# Patient Record
Sex: Female | Born: 1940
Health system: Southern US, Community
[De-identification: ages and names within clinical notes are randomized; demographics above are authoritative.]

## PROBLEM LIST (undated history)

## (undated) DIAGNOSIS — N281 Cyst of kidney, acquired: Secondary | ICD-10-CM

## (undated) DIAGNOSIS — Q211 Atrial septal defect: Secondary | ICD-10-CM

## (undated) DIAGNOSIS — I071 Rheumatic tricuspid insufficiency: Secondary | ICD-10-CM

## (undated) DIAGNOSIS — E119 Type 2 diabetes mellitus without complications: Secondary | ICD-10-CM

## (undated) DIAGNOSIS — R0602 Shortness of breath: Secondary | ICD-10-CM

## (undated) DIAGNOSIS — Z889 Allergy status to unspecified drugs, medicaments and biological substances status: Secondary | ICD-10-CM

## (undated) DIAGNOSIS — M51369 Other intervertebral disc degeneration, lumbar region without mention of lumbar back pain or lower extremity pain: Secondary | ICD-10-CM

## (undated) DIAGNOSIS — K7689 Other specified diseases of liver: Secondary | ICD-10-CM

## (undated) DIAGNOSIS — Q2112 Patent foramen ovale: Secondary | ICD-10-CM

## (undated) DIAGNOSIS — G510 Bell's palsy: Secondary | ICD-10-CM

## (undated) DIAGNOSIS — F419 Anxiety disorder, unspecified: Secondary | ICD-10-CM

## (undated) DIAGNOSIS — Z87442 Personal history of urinary calculi: Secondary | ICD-10-CM

## (undated) DIAGNOSIS — I10 Essential (primary) hypertension: Secondary | ICD-10-CM

## (undated) DIAGNOSIS — I499 Cardiac arrhythmia, unspecified: Secondary | ICD-10-CM

## (undated) DIAGNOSIS — E785 Hyperlipidemia, unspecified: Secondary | ICD-10-CM

## (undated) DIAGNOSIS — B029 Zoster without complications: Secondary | ICD-10-CM

## (undated) DIAGNOSIS — I340 Nonrheumatic mitral (valve) insufficiency: Secondary | ICD-10-CM

## (undated) DIAGNOSIS — H9192 Unspecified hearing loss, left ear: Secondary | ICD-10-CM

## (undated) DIAGNOSIS — Z950 Presence of cardiac pacemaker: Secondary | ICD-10-CM

## (undated) DIAGNOSIS — M5136 Other intervertebral disc degeneration, lumbar region: Secondary | ICD-10-CM

## (undated) DIAGNOSIS — I517 Cardiomegaly: Secondary | ICD-10-CM

## (undated) HISTORY — DX: Hyperlipidemia, unspecified: E78.5

## (undated) HISTORY — PX: CHOLECYSTECTOMY: SHX55

## (undated) HISTORY — DX: Anxiety disorder, unspecified: F41.9

## (undated) HISTORY — PX: OTHER SURGICAL HISTORY: SHX169

## (undated) HISTORY — PX: BUNIONECTOMY: SHX129

## (undated) HISTORY — PX: APPENDECTOMY: SHX54

## (undated) HISTORY — PX: EYE SURGERY: SHX253

---

## 2007-08-12 DIAGNOSIS — B029 Zoster without complications: Secondary | ICD-10-CM

## 2007-08-12 DIAGNOSIS — G510 Bell's palsy: Secondary | ICD-10-CM

## 2007-08-12 HISTORY — DX: Zoster without complications: B02.9

## 2007-08-12 HISTORY — DX: Bell's palsy: G51.0

## 2007-11-02 DIAGNOSIS — Z950 Presence of cardiac pacemaker: Secondary | ICD-10-CM

## 2007-11-02 HISTORY — DX: Presence of cardiac pacemaker: Z95.0

## 2013-10-13 ENCOUNTER — Other Ambulatory Visit: Payer: Self-pay | Admitting: Urology

## 2013-10-13 ENCOUNTER — Other Ambulatory Visit (HOSPITAL_COMMUNITY): Payer: Self-pay | Admitting: Urology

## 2013-10-13 DIAGNOSIS — N200 Calculus of kidney: Secondary | ICD-10-CM

## 2013-10-13 NOTE — Progress Notes (Signed)
Surgery on 10/24/13.  preop on 10/20/13 at 1030am.  Need orders in EPIC.  Thank You.

## 2013-10-18 ENCOUNTER — Encounter (HOSPITAL_COMMUNITY): Payer: Self-pay | Admitting: Pharmacy Technician

## 2013-10-19 ENCOUNTER — Encounter (HOSPITAL_COMMUNITY): Payer: Self-pay

## 2013-10-20 ENCOUNTER — Encounter (HOSPITAL_COMMUNITY)
Admission: RE | Admit: 2013-10-20 | Discharge: 2013-10-20 | Disposition: A | Discharge status: Home / Self Care | Payer: Medicare Other | Source: Ambulatory Visit | Attending: Urology | Admitting: Urology

## 2013-10-20 ENCOUNTER — Encounter (HOSPITAL_COMMUNITY): Payer: Self-pay

## 2013-10-20 ENCOUNTER — Other Ambulatory Visit: Payer: Self-pay | Admitting: Radiology

## 2013-10-20 ENCOUNTER — Ambulatory Visit (HOSPITAL_COMMUNITY)
Admission: RE | Admit: 2013-10-20 | Discharge: 2013-10-20 | Disposition: A | Discharge status: Home / Self Care | Payer: Medicare Other | Source: Ambulatory Visit | Attending: Anesthesiology | Admitting: Anesthesiology

## 2013-10-20 DIAGNOSIS — N2889 Other specified disorders of kidney and ureter: Secondary | ICD-10-CM | POA: Insufficient documentation

## 2013-10-20 DIAGNOSIS — Z01818 Encounter for other preprocedural examination: Secondary | ICD-10-CM | POA: Insufficient documentation

## 2013-10-20 DIAGNOSIS — Z01812 Encounter for preprocedural laboratory examination: Secondary | ICD-10-CM | POA: Insufficient documentation

## 2013-10-20 DIAGNOSIS — Z950 Presence of cardiac pacemaker: Secondary | ICD-10-CM | POA: Insufficient documentation

## 2013-10-20 HISTORY — DX: Other intervertebral disc degeneration, lumbar region: M51.36

## 2013-10-20 HISTORY — DX: Patent foramen ovale: Q21.12

## 2013-10-20 HISTORY — DX: Cardiac arrhythmia, unspecified: I49.9

## 2013-10-20 HISTORY — DX: Bell's palsy: G51.0

## 2013-10-20 HISTORY — DX: Personal history of urinary calculi: Z87.442

## 2013-10-20 HISTORY — DX: Allergy status to unspecified drugs, medicaments and biological substances: Z88.9

## 2013-10-20 HISTORY — DX: Zoster without complications: B02.9

## 2013-10-20 HISTORY — DX: Unspecified hearing loss, left ear: H91.92

## 2013-10-20 HISTORY — DX: Cardiomegaly: I51.7

## 2013-10-20 HISTORY — DX: Other specified diseases of liver: K76.89

## 2013-10-20 HISTORY — DX: Presence of cardiac pacemaker: Z95.0

## 2013-10-20 HISTORY — DX: Type 2 diabetes mellitus without complications: E11.9

## 2013-10-20 HISTORY — DX: Atrial septal defect: Q21.1

## 2013-10-20 HISTORY — DX: Cyst of kidney, acquired: N28.1

## 2013-10-20 HISTORY — DX: Other intervertebral disc degeneration, lumbar region without mention of lumbar back pain or lower extremity pain: M51.369

## 2013-10-20 HISTORY — DX: Nonrheumatic mitral (valve) insufficiency: I34.0

## 2013-10-20 HISTORY — DX: Rheumatic tricuspid insufficiency: I07.1

## 2013-10-20 HISTORY — DX: Shortness of breath: R06.02

## 2013-10-20 HISTORY — DX: Essential (primary) hypertension: I10

## 2013-10-20 LAB — PROTIME-INR
INR: 1.24 (ref 0.00–1.49)
PROTHROMBIN TIME: 15.3 s — AB (ref 11.6–15.2)

## 2013-10-20 LAB — BASIC METABOLIC PANEL
BUN: 10 mg/dL (ref 6–23)
CALCIUM: 9.8 mg/dL (ref 8.4–10.5)
CO2: 25 mEq/L (ref 19–32)
Chloride: 101 mEq/L (ref 96–112)
Creatinine, Ser: 0.72 mg/dL (ref 0.50–1.10)
GFR, EST NON AFRICAN AMERICAN: 84 mL/min — AB (ref >90)
GLUCOSE: 104 mg/dL — AB (ref 70–99)
Potassium: 4.3 mEq/L (ref 3.7–5.3)
Sodium: 141 mEq/L (ref 137–147)

## 2013-10-20 LAB — CBC
HCT: 37.9 % (ref 36.0–46.0)
Hemoglobin: 12.5 g/dL (ref 12.0–15.0)
MCH: 28 pg (ref 26.0–34.0)
MCHC: 33 g/dL (ref 30.0–36.0)
MCV: 84.8 fL (ref 78.0–100.0)
PLATELETS: 356 10*3/uL (ref 150–400)
RBC: 4.47 MIL/uL (ref 3.87–5.11)
RDW: 14 % (ref 11.5–15.5)
WBC: 7.8 10*3/uL (ref 4.0–10.5)

## 2013-10-20 LAB — APTT: aPTT: 31 seconds (ref 24–37)

## 2013-10-20 LAB — ABO/RH: ABO/RH(D): AB POS

## 2013-10-20 NOTE — Pre-Procedure Instructions (Signed)
CXR WAS DONE TODAY - PREOP - AT [facility]. EKG REPORT REQUESTED FROM PT'S CARDIOLOGIST'S OFFICE - DR. PAINTER.  CARDIOLOGIST OFFICE NOTES 07/19/13 ON PT'S CHART. PT HAS PACEMAKER - LAST INTERROGATION WAS 07/15/13- SUMMARY ON PT'S CHART.  DR. Sharyon Cable WAS FAXED PERIOPERATIVE PRESCRIPTION FOR IMPLANTED CARDIAC DEVICE PROGRAMMING X 2 - HE SIGNED THE ORDERS - BUT DID NOT COMPLETE.  COPY MADE OF PT'S ST JUDE PACEMAKER CARD AND PLACED ON CHART.

## 2013-10-20 NOTE — Patient Instructions (Signed)
   YOUR SURGERY IS SCHEDULED AT [facility]  ON:  Monday  3/16  REPORT TO  RADIOLOGY AT:  7:30 AM      PHONE # FOR SHORT STAY IS [phone]  DO NOT EAT OR DRINK ANYTHING AFTER MIDNIGHT THE NIGHT BEFORE YOUR SURGERY.  YOU [place] BRUSH YOUR TEETH, RINSE OUT YOUR MOUTH--BUT NO WATER, NO FOOD, NO CHEWING GUM, NO MINTS, NO CANDIES, NO CHEWING TOBACCO.  PLEASE TAKE THE FOLLOWING MEDICATIONS THE AM OF YOUR SURGERY WITH A FEW SIPS OF WATER:  CARDIZEM, METOPROLOL, SOTALOL.  IF YOU ARE DIABETIC:  DO NOT TAKE ANY DIABETIC MEDICATIONS THE AM OF YOUR SURGERY.  DO NOT BRING VALUABLES, MONEY, CREDIT CARDS.  DO NOT WEAR JEWELRY, MAKE-UP, NAIL POLISH AND NO METAL PINS OR CLIPS IN YOUR HAIR. CONTACT LENS, DENTURES / PARTIALS, GLASSES SHOULD NOT BE WORN TO SURGERY AND IN MOST CASES-HEARING AIDS WILL NEED TO BE REMOVED.  BRING YOUR GLASSES CASE, ANY EQUIPMENT NEEDED FOR YOUR CONTACT LENS. FOR PATIENTS ADMITTED TO THE HOSPITAL--CHECK OUT TIME THE DAY OF DISCHARGE IS 11:00 AM.  ALL INPATIENT ROOMS ARE PRIVATE - WITH BATHROOM, TELEPHONE, TELEVISION AND WIFI INTERNET.   FAILURE TO FOLLOW THESE INSTRUCTIONS [place] RESULT IN THE CANCELLATION OF YOUR SURGERY. PLEASE BE AWARE THAT YOU [place] NEED ADDITIONAL BLOOD DRAWN DAY OF YOUR SURGERY  PATIENT SIGNATURE_________________________________

## 2013-10-20 NOTE — Pre-Procedure Instructions (Signed)
EKG REPORT 07/19/13 RECEIVED FROM DR. PAINTER'S OFFICE AND ON PT'S CHART.

## 2013-10-21 NOTE — H&P (Signed)
History of Present Illness   Kristy Washington presents today as a referral from Dr Clyde Lundborg for consideration of percutaneous nephrolithotomy to manage her recently diagnosed partial staghorn stone. Kristy Washington is very pleasant 73 year old female without any real significant urologic history. She has no prior history of nephrolithiasis. She believes her father had 1 episode. As a younger woman, she did have occasional sporadic urinary tract infections, but that has not been a real significant recent problem. More recently she did experience some limited gross hematuria. A CT scan was performed at [facility], which I have reviewed. She had a rather large 2-3 cm stone at the ureteropelvic junction filling her renal pelvis and causing at least some mild obstruction. There were additional stones within the lower pole calyx and is really felt to be a partial staghorn calculus. Dr Marica Otter quite appropriately felt that this amount of stone burden would be inappropriate for ESWL and kindly referred her to [place] for consideration of a percutaneous nephrolithotomy. Complicating factors include a history of atrial fibrillation and pacemaker implant. She is on chronic coagulation and more recently has been on Xarelto. In the past she was on Coumadin, which had been discontinued periodically for procedures, but she has been on Xarelto fairly regularly. She also takes a daily aspirin. She was treated for urinary tract infection, although it was unclear to me whether her urine was abnormal due to the urinary tract inflammation or whether she had a true infection. She reports cloudy urine but no real dysuria or other problematic voiding. Her urinalysis today does show too numerous to count white cells and too numerous to count red cells. Urine pH is 6.0.    Past Medical History Problems  1. History of atrial fibrillation (V12.59) 2. History of diabetes mellitus (V12.29) 3. History of hypertension (V12.59)  Surgical  History Problems  1. History of Appendectomy 2. History of Cataract Surgery 3. History of Cholecystectomy 4. History of Pacemaker Placement 5. History of Surgical Excision Of Ectopic Pregnancy Unspecified Location  Current Meds 1. AmLODIPine Besylate 2.5 MG Oral Tablet;  Therapy: (Recorded:03Mar2015) to Recorded 2. Calcium + D TABS;  Therapy: (Recorded:03Mar2015) to Recorded 3. Cartia XT 240 MG Oral Capsule Extended Release 24 Hour;  Therapy: (Recorded:03Mar2015) to Recorded 4. Lisinopril 20 MG Oral Tablet;  Therapy: (Recorded:03Mar2015) to Recorded 5. MetFORMIN HCl - 1000 MG Oral Tablet;  Therapy: (Recorded:03Mar2015) to Recorded 6. Metoprolol Tartrate 50 MG Oral Tablet;  Therapy: (Recorded:03Mar2015) to Recorded 7. Potassium Chloride Crys ER 20 MEQ Oral Tablet Extended Release;  Therapy: [id number] to Recorded 8. Simvastatin 5 MG Oral Tablet;  Therapy: (Recorded:03Mar2015) to Recorded 9. Sotalol HCl - 80 MG Oral Tablet;  Therapy: (Recorded:03Mar2015) to Recorded 10. Xarelto 20 MG Oral Tablet;   Therapy: (Recorded:03Mar2015) to Recorded  Allergies Medication  1. Actos TABS  Family History Problems  1. Family history of COPD (chronic obstructive pulmonary disease) with emphysema :  Mother 2. Family history of kidney stones (V18.69) : Father  Social History Problems    Denied: History of Alcohol use   Caffeine use (V49.89)   rare   Divorced   Father deceased   67   Mother deceased   71 from COPD   Never smoker   Denied: History of Number of children   Retired  Review of Systems Genitourinary, constitutional, skin, eye, otolaryngeal, hematologic/lymphatic, cardiovascular, pulmonary, endocrine, musculoskeletal, gastrointestinal, neurological and psychiatric system(s) were reviewed and pertinent findings if present are noted.  Genitourinary: nocturia, incontinence and hematuria.  Musculoskeletal: back  pain.    Vitals Vital Signs [Data Includes: Last 1  Day]  Recorded: [id number] 11:40AM  Height: 5 ft 6 in Weight: 158 lb  BMI Calculated: 25.5 BSA Calculated: 1.81 Blood Pressure: 129 / 70 Temperature: 98.3 F Heart Rate: 71  Physical Exam Constitutional: Well nourished and well developed . No acute distress.  ENT:. The ears and nose are normal in appearance.  Neck: The appearance of the neck is normal and no neck mass is present.  Pulmonary: No respiratory distress and normal respiratory rhythm and effort.  Cardiovascular: Heart rate and rhythm are normal . No peripheral edema.  Abdomen: The abdomen is soft and nontender. No masses are palpated. No CVA tenderness. No hernias are palpable. No hepatosplenomegaly noted.  Skin: Normal skin turgor, no visible rash and no visible skin lesions.  Neuro/Psych:. Mood and affect are appropriate.    Results/Data Urine [Data Includes: Last 1 Day]   [id number]  COLOR YELLOW   APPEARANCE CLOUDY   SPECIFIC GRAVITY 1.015   pH 6.0   GLUCOSE NEG mg/dL  BILIRUBIN NEG   KETONE NEG mg/dL  BLOOD LARGE   PROTEIN 100 mg/dL  UROBILINOGEN 0.2 mg/dL  NITRITE NEG   LEUKOCYTE ESTERASE MOD   SQUAMOUS EPITHELIAL/HPF RARE   WBC TNTC WBC/hpf  RBC TNTC RBC/hpf  BACTERIA MANY   CRYSTALS NONE SEEN   CASTS NONE SEEN    Assessment Assessed  1. Chronic cystitis (595.2) 2. Staghorn calculus (592.0)  Plan Chronic cystitis  1. Follow-up Schedule Surgery Office  Follow-up  Status: Hold For - Appointment   Requested for: [id number] 2. URINE CULTURE; Status:In Progress - Specimen/Data Collected;   Done: [id number] Health Maintenance  3. UA With REFLEX; [Do Not Release]; Status:Complete;   Done: [id number] 11:07AM  Discussion/Summary   Kristy Glazer has a partial staghorn stone involving the right kidney. This does appear to be causing some mild partial obstruction but otherwise she seems to be doing relatively well clinically. There is no urgent need for intervention but certainly nothing good can come of leaving the  stone of this size in her right renal unit. I do think that percutaneous nephrolithotomy would certainly be the treatment of choice and the overall stone burden would be accepted for ESWL. In addition, she would need to stop her anticoagulation for either procedure and it would make much more sense to try to definitively manage her problem with one intervention if at all possible. It is possible that we will only be able to debulk her stone and there Mang[place] be some residual small stones in the calyces. If that occurs, we can either consider a second look flexible ureteroscopic approach through a nephrostomy tube, or consider ESWL at a later date. I did explain a percutaneous nephrolithotomy procedure to her. We would have Interventional Radiology provide access the morning of the procedure and then proceed with a definitive treatment. She would need to hold aspirin for at least 5-7 days as well as her Xarelto. We will need to contact her cardiologist in [place] to be sure that he provides permission for that. We will try to set something up for sometime in the next several weeks.   Urine culture will need to be performed today. Hold off on any empiric antibiotic use until we have culture data.

## 2013-10-24 ENCOUNTER — Observation Stay (HOSPITAL_COMMUNITY)
Admission: RE | Admit: 2013-10-24 | Discharge: 2013-10-26 | Disposition: A | Discharge status: Home / Self Care | Payer: Medicare Other | Source: Ambulatory Visit | Attending: Urology | Admitting: Urology

## 2013-10-24 ENCOUNTER — Ambulatory Visit (HOSPITAL_COMMUNITY): Payer: Medicare Other | Admitting: Anesthesiology

## 2013-10-24 ENCOUNTER — Ambulatory Visit (HOSPITAL_COMMUNITY): Payer: Medicare Other

## 2013-10-24 ENCOUNTER — Encounter (HOSPITAL_COMMUNITY): Payer: Self-pay | Admitting: Anesthesiology

## 2013-10-24 ENCOUNTER — Encounter (HOSPITAL_COMMUNITY): Payer: Self-pay

## 2013-10-24 ENCOUNTER — Ambulatory Visit (HOSPITAL_COMMUNITY)
Admission: RE | Admit: 2013-10-24 | Discharge: 2013-10-24 | Disposition: A | Discharge status: Home / Self Care | Payer: Medicare Other | Source: Ambulatory Visit | Attending: Urology | Admitting: Urology

## 2013-10-24 ENCOUNTER — Encounter (HOSPITAL_COMMUNITY): Payer: Medicare Other | Admitting: Anesthesiology

## 2013-10-24 ENCOUNTER — Encounter (HOSPITAL_COMMUNITY): Payer: Self-pay | Admitting: *Deleted

## 2013-10-24 ENCOUNTER — Encounter (HOSPITAL_COMMUNITY)
Admission: RE | Disposition: A | Discharge status: Home / Self Care | Payer: Self-pay | Source: Ambulatory Visit | Attending: Urology

## 2013-10-24 DIAGNOSIS — Z7901 Long term (current) use of anticoagulants: Secondary | ICD-10-CM | POA: Insufficient documentation

## 2013-10-24 DIAGNOSIS — Z950 Presence of cardiac pacemaker: Secondary | ICD-10-CM | POA: Insufficient documentation

## 2013-10-24 DIAGNOSIS — N1330 Unspecified hydronephrosis: Secondary | ICD-10-CM | POA: Insufficient documentation

## 2013-10-24 DIAGNOSIS — E119 Type 2 diabetes mellitus without complications: Secondary | ICD-10-CM | POA: Insufficient documentation

## 2013-10-24 DIAGNOSIS — Z79899 Other long term (current) drug therapy: Secondary | ICD-10-CM | POA: Insufficient documentation

## 2013-10-24 DIAGNOSIS — N200 Calculus of kidney: Principal | ICD-10-CM | POA: Insufficient documentation

## 2013-10-24 DIAGNOSIS — I059 Rheumatic mitral valve disease, unspecified: Secondary | ICD-10-CM | POA: Insufficient documentation

## 2013-10-24 DIAGNOSIS — I10 Essential (primary) hypertension: Secondary | ICD-10-CM | POA: Insufficient documentation

## 2013-10-24 DIAGNOSIS — I4891 Unspecified atrial fibrillation: Secondary | ICD-10-CM | POA: Insufficient documentation

## 2013-10-24 DIAGNOSIS — N3020 Other chronic cystitis without hematuria: Secondary | ICD-10-CM | POA: Insufficient documentation

## 2013-10-24 DIAGNOSIS — Z9089 Acquired absence of other organs: Secondary | ICD-10-CM | POA: Insufficient documentation

## 2013-10-24 HISTORY — PX: NEPHROLITHOTOMY: SHX5134

## 2013-10-24 LAB — GLUCOSE, CAPILLARY
GLUCOSE-CAPILLARY: 136 mg/dL — AB (ref 70–99)
GLUCOSE-CAPILLARY: 157 mg/dL — AB (ref 70–99)
GLUCOSE-CAPILLARY: 147 mg/dL — AB (ref 70–99)
Glucose-Capillary: 145 mg/dL — ABNORMAL HIGH (ref 70–99)

## 2013-10-24 LAB — TYPE AND SCREEN
ABO/RH(D): AB POS
Antibody Screen: NEGATIVE

## 2013-10-24 SURGERY — NEPHROLITHOTOMY PERCUTANEOUS
Anesthesia: General | Site: Flank | Laterality: Right

## 2013-10-24 MED ORDER — MIDAZOLAM HCL 2 MG/2ML IJ SOLN
INTRAMUSCULAR | Status: AC | PRN
  Administered 2013-10-24 (×2): 0.5 mg via INTRAVENOUS
  Administered 2013-10-24: 1 mg via INTRAVENOUS

## 2013-10-24 MED ORDER — OXYBUTYNIN CHLORIDE 5 MG PO TABS
5.0000 mg | ORAL_TABLET | Freq: Three times a day (TID) | ORAL | Status: DC | PRN
  Filled 2013-10-24: qty 1

## 2013-10-24 MED ORDER — FENTANYL CITRATE 0.05 MG/ML IJ SOLN
INTRAMUSCULAR | Status: AC
  Filled 2013-10-24: qty 2

## 2013-10-24 MED ORDER — METFORMIN HCL 500 MG PO TABS
500.0000 mg | ORAL_TABLET | Freq: Two times a day (BID) | ORAL | Status: DC
  Administered 2013-10-24 – 2013-10-26 (×4): 500 mg via ORAL
  Filled 2013-10-24 (×7): qty 1

## 2013-10-24 MED ORDER — LORAZEPAM 2 MG/ML IJ SOLN: INTRAMUSCULAR | Status: AC | PRN

## 2013-10-24 MED ORDER — DEXTROSE 5 % IV SOLN
2.0000 g | Freq: Once | INTRAVENOUS | Status: AC
  Administered 2013-10-24: 2 g via INTRAVENOUS
  Filled 2013-10-24: qty 2

## 2013-10-24 MED ORDER — SODIUM CHLORIDE 0.9 % IR SOLN
Status: DC | PRN
  Administered 2013-10-24 (×4): 3000 mL

## 2013-10-24 MED ORDER — 0.9 % SODIUM CHLORIDE (POUR BTL) OPTIME
TOPICAL | Status: DC | PRN
  Administered 2013-10-24: 1000 mL

## 2013-10-24 MED ORDER — HYDROMORPHONE HCL PF 1 MG/ML IJ SOLN
0.2500 mg | INTRAMUSCULAR | Status: DC | PRN
  Administered 2013-10-24: 0.5 mg via INTRAVENOUS

## 2013-10-24 MED ORDER — PROPOFOL 10 MG/ML IV BOLUS
INTRAVENOUS | Status: AC
  Filled 2013-10-24: qty 20

## 2013-10-24 MED ORDER — GLYCOPYRROLATE 0.2 MG/ML IJ SOLN
INTRAMUSCULAR | Status: AC
  Filled 2013-10-24: qty 3

## 2013-10-24 MED ORDER — NEOSTIGMINE METHYLSULFATE 1 MG/ML IJ SOLN
INTRAMUSCULAR | Status: DC | PRN
  Administered 2013-10-24: 4 mg via INTRAVENOUS

## 2013-10-24 MED ORDER — NEOSTIGMINE METHYLSULFATE 1 MG/ML IJ SOLN
INTRAMUSCULAR | Status: AC
  Filled 2013-10-24: qty 10

## 2013-10-24 MED ORDER — FENTANYL CITRATE 0.05 MG/ML IJ SOLN
INTRAMUSCULAR | Status: AC
  Filled 2013-10-24: qty 5

## 2013-10-24 MED ORDER — LIDOCAINE HCL 1 % IJ SOLN
INTRAMUSCULAR | Status: AC
  Filled 2013-10-24: qty 20

## 2013-10-24 MED ORDER — LACTATED RINGERS IV SOLN
INTRAVENOUS | Status: DC
  Administered 2013-10-24: 1000 mL via INTRAVENOUS

## 2013-10-24 MED ORDER — SOTALOL HCL 80 MG PO TABS: 80.0000 mg | ORAL_TABLET | Freq: Two times a day (BID) | ORAL | Status: DC

## 2013-10-24 MED ORDER — MIDAZOLAM HCL 2 MG/2ML IJ SOLN
INTRAMUSCULAR | Status: AC
  Filled 2013-10-24: qty 8

## 2013-10-24 MED ORDER — CEFAZOLIN SODIUM-DEXTROSE 2-3 GM-% IV SOLR: 2.0000 g | INTRAVENOUS | Status: DC

## 2013-10-24 MED ORDER — LACTATED RINGERS IV SOLN
INTRAVENOUS | Status: AC | PRN
  Administered 2013-10-24: 100 mL/h via INTRAVENOUS

## 2013-10-24 MED ORDER — CISATRACURIUM BESYLATE 20 MG/10ML IV SOLN
INTRAVENOUS | Status: AC
  Filled 2013-10-24: qty 10

## 2013-10-24 MED ORDER — SIMVASTATIN 5 MG PO TABS
5.0000 mg | ORAL_TABLET | Freq: Every day | ORAL | Status: DC
  Administered 2013-10-24 – 2013-10-25 (×2): 5 mg via ORAL
  Filled 2013-10-24 (×4): qty 1

## 2013-10-24 MED ORDER — METOPROLOL TARTRATE 50 MG PO TABS
50.0000 mg | ORAL_TABLET | Freq: Two times a day (BID) | ORAL | Status: DC
  Administered 2013-10-24 – 2013-10-25 (×3): 50 mg via ORAL
  Filled 2013-10-24 (×6): qty 1

## 2013-10-24 MED ORDER — LISINOPRIL 20 MG PO TABS
20.0000 mg | ORAL_TABLET | Freq: Two times a day (BID) | ORAL | Status: DC
  Administered 2013-10-24 – 2013-10-25 (×3): 20 mg via ORAL
  Filled 2013-10-24 (×6): qty 1

## 2013-10-24 MED ORDER — LACTATED RINGERS IV SOLN
INTRAVENOUS | Status: DC
  Administered 2013-10-24 (×2): via INTRAVENOUS

## 2013-10-24 MED ORDER — MORPHINE SULFATE 2 MG/ML IJ SOLN
2.0000 mg | INTRAMUSCULAR | Status: DC | PRN
  Administered 2013-10-24 – 2013-10-25 (×3): 2 mg via INTRAVENOUS
  Filled 2013-10-24 (×4): qty 1

## 2013-10-24 MED ORDER — SOTALOL HCL 120 MG PO TABS
120.0000 mg | ORAL_TABLET | Freq: Every day | ORAL | Status: DC
  Administered 2013-10-25: 120 mg via ORAL
  Filled 2013-10-24 (×2): qty 1

## 2013-10-24 MED ORDER — FENTANYL CITRATE 0.05 MG/ML IJ SOLN
INTRAMUSCULAR | Status: AC | PRN
Start: 2013-10-24 — End: 2013-10-24
  Administered 2013-10-24: 50 ug via INTRAVENOUS

## 2013-10-24 MED ORDER — LIDOCAINE HCL 1 % IJ SOLN
INTRAMUSCULAR | Status: AC
  Filled 2013-10-24: qty 40

## 2013-10-24 MED ORDER — POTASSIUM CHLORIDE CRYS ER 20 MEQ PO TBCR
20.0000 meq | EXTENDED_RELEASE_TABLET | Freq: Every day | ORAL | Status: DC
  Administered 2013-10-24 – 2013-10-25 (×2): 20 meq via ORAL
  Filled 2013-10-24 (×4): qty 1

## 2013-10-24 MED ORDER — SOTALOL HCL 80 MG PO TABS
80.0000 mg | ORAL_TABLET | Freq: Every day | ORAL | Status: DC
  Administered 2013-10-24 – 2013-10-25 (×2): 80 mg via ORAL
  Filled 2013-10-24 (×4): qty 1

## 2013-10-24 MED ORDER — PROMETHAZINE HCL 25 MG/ML IJ SOLN
INTRAMUSCULAR | Status: AC
  Filled 2013-10-24: qty 1

## 2013-10-24 MED ORDER — HYDROMORPHONE HCL PF 1 MG/ML IJ SOLN
INTRAMUSCULAR | Status: AC
  Filled 2013-10-24: qty 1

## 2013-10-24 MED ORDER — CEFAZOLIN SODIUM-DEXTROSE 2-3 GM-% IV SOLR
INTRAVENOUS | Status: AC
  Administered 2013-10-24: 2000 mg
  Filled 2013-10-24: qty 50

## 2013-10-24 MED ORDER — AMLODIPINE BESYLATE 2.5 MG PO TABS
2.5000 mg | ORAL_TABLET | Freq: Every evening | ORAL | Status: DC
  Administered 2013-10-24 – 2013-10-25 (×2): 2.5 mg via ORAL
  Filled 2013-10-24 (×4): qty 1

## 2013-10-24 MED ORDER — FENTANYL CITRATE 0.05 MG/ML IJ SOLN
INTRAMUSCULAR | Status: AC
  Administered 2013-10-24 (×2): 50 ug via INTRAVENOUS
  Filled 2013-10-24: qty 8

## 2013-10-24 MED ORDER — PROPOFOL 10 MG/ML IV BOLUS
INTRAVENOUS | Status: DC | PRN
  Administered 2013-10-24: 100 mg via INTRAVENOUS

## 2013-10-24 MED ORDER — PHENYLEPHRINE 40 MCG/ML (10ML) SYRINGE FOR IV PUSH (FOR BLOOD PRESSURE SUPPORT)
PREFILLED_SYRINGE | INTRAVENOUS | Status: AC
  Filled 2013-10-24: qty 10

## 2013-10-24 MED ORDER — GLYCOPYRROLATE 0.2 MG/ML IJ SOLN
INTRAMUSCULAR | Status: DC | PRN
  Administered 2013-10-24: 0.6 mg via INTRAVENOUS

## 2013-10-24 MED ORDER — CEFAZOLIN SODIUM 1-5 GM-% IV SOLN
1.0000 g | Freq: Three times a day (TID) | INTRAVENOUS | Status: AC
  Administered 2013-10-24 – 2013-10-25 (×2): 1 g via INTRAVENOUS
  Filled 2013-10-24 (×2): qty 50

## 2013-10-24 MED ORDER — CISATRACURIUM BESYLATE (PF) 10 MG/5ML IV SOLN
INTRAVENOUS | Status: DC | PRN
  Administered 2013-10-24: 6 mg via INTRAVENOUS

## 2013-10-24 MED ORDER — KCL IN DEXTROSE-NACL 20-5-0.45 MEQ/L-%-% IV SOLN
INTRAVENOUS | Status: DC
  Administered 2013-10-24: 19:00:00 via INTRAVENOUS
  Filled 2013-10-24 (×5): qty 1000

## 2013-10-24 MED ORDER — FENTANYL CITRATE 0.05 MG/ML IJ SOLN
25.0000 ug | INTRAMUSCULAR | Status: DC | PRN
  Administered 2013-10-24 (×2): 50 ug via INTRAVENOUS

## 2013-10-24 MED ORDER — IOHEXOL 300 MG/ML  SOLN
INTRAMUSCULAR | Status: DC | PRN
  Administered 2013-10-24: 5 mL

## 2013-10-24 MED ORDER — PHENYLEPHRINE HCL 10 MG/ML IJ SOLN
INTRAMUSCULAR | Status: DC | PRN
  Administered 2013-10-24 (×3): 40 ug via INTRAVENOUS

## 2013-10-24 MED ORDER — IOHEXOL 300 MG/ML  SOLN
50.0000 mL | Freq: Once | INTRAMUSCULAR | Status: AC | PRN
  Administered 2013-10-24: 10 mL

## 2013-10-24 MED ORDER — PROMETHAZINE HCL 25 MG/ML IJ SOLN
6.2500 mg | INTRAMUSCULAR | Status: DC | PRN
Start: 2013-10-24 — End: 2013-10-24
  Administered 2013-10-24: 6.25 mg via INTRAVENOUS

## 2013-10-24 MED ORDER — ONDANSETRON HCL 4 MG/2ML IJ SOLN
4.0000 mg | INTRAMUSCULAR | Status: DC | PRN
  Administered 2013-10-24 – 2013-10-25 (×3): 4 mg via INTRAVENOUS
  Filled 2013-10-24 (×4): qty 2

## 2013-10-24 MED ORDER — HYDROCODONE-ACETAMINOPHEN 5-325 MG PO TABS: 1.0000 | ORAL_TABLET | ORAL | Status: DC | PRN

## 2013-10-24 MED ORDER — ONDANSETRON HCL 4 MG/2ML IJ SOLN
INTRAMUSCULAR | Status: DC | PRN
  Administered 2013-10-24: 4 mg via INTRAVENOUS

## 2013-10-24 MED ORDER — SUCCINYLCHOLINE CHLORIDE 20 MG/ML IJ SOLN
INTRAMUSCULAR | Status: DC | PRN
  Administered 2013-10-24: 100 mg via INTRAVENOUS

## 2013-10-24 MED ORDER — SODIUM CHLORIDE 0.9 % IJ SOLN
INTRAMUSCULAR | Status: AC
  Filled 2013-10-24: qty 10

## 2013-10-24 MED ORDER — INSULIN ASPART 100 UNIT/ML ~~LOC~~ SOLN
0.0000 [IU] | Freq: Three times a day (TID) | SUBCUTANEOUS | Status: DC
  Administered 2013-10-24 – 2013-10-25 (×4): 3 [IU] via SUBCUTANEOUS

## 2013-10-24 MED ORDER — DILTIAZEM HCL ER COATED BEADS 240 MG PO CP24
240.0000 mg | ORAL_CAPSULE | Freq: Every morning | ORAL | Status: DC
  Administered 2013-10-25: 240 mg via ORAL
  Filled 2013-10-24 (×2): qty 1

## 2013-10-24 SURGICAL SUPPLY — 47 items
BAG URINE DRAINAGE (UROLOGICAL SUPPLIES) IMPLANT
BAG URO CATCHER STRL LF (DRAPE) IMPLANT
BASKET ZERO TIP NITINOL 2.4FR (BASKET) IMPLANT
BENZOIN TINCTURE PRP APPL 2/3 (GAUZE/BANDAGES/DRESSINGS) ×8 IMPLANT
CATH FOLEY 2W COUNCIL 20FR 5CC (CATHETERS) IMPLANT
CATH FOLEY 2W COUNCIL 5CC 16FR (CATHETERS) ×4 IMPLANT
CATH FOLEY 2WAY SLVR  5CC 18FR (CATHETERS) ×2
CATH FOLEY 2WAY SLVR 5CC 18FR (CATHETERS) ×2 IMPLANT
CATH ROBINSON RED A/P 20FR (CATHETERS) IMPLANT
CATH X-FORCE N30 NEPHROSTOMY (TUBING) ×4 IMPLANT
COVER SURGICAL LIGHT HANDLE (MISCELLANEOUS) ×4 IMPLANT
DRAPE C-ARM 42X120 X-RAY (DRAPES) ×4 IMPLANT
DRAPE CAMERA CLOSED 9X96 (DRAPES) ×4 IMPLANT
DRAPE LINGEMAN PERC (DRAPES) ×4 IMPLANT
DRAPE SURG IRRIG POUCH 19X23 (DRAPES) ×4 IMPLANT
DRSG TEGADERM 8X12 (GAUZE/BANDAGES/DRESSINGS) ×4 IMPLANT
FIBER LASER FLEXIVA 550 (UROLOGICAL SUPPLIES) IMPLANT
GLOVE BIOGEL M STRL SZ7.5 (GLOVE) ×4 IMPLANT
GOWN STRL REUS W/TWL XL LVL3 (GOWN DISPOSABLE) ×8 IMPLANT
GUIDEWIRE AMPLAZ .035X145 (WIRE) ×8 IMPLANT
KIT BASIN OR (CUSTOM PROCEDURE TRAY) ×4 IMPLANT
LASER FIBER DISP 1000U (UROLOGICAL SUPPLIES) IMPLANT
MANIFOLD NEPTUNE II (INSTRUMENTS) ×4 IMPLANT
NS IRRIG 1000ML POUR BTL (IV SOLUTION) IMPLANT
PACK BASIC VI WITH GOWN DISP (CUSTOM PROCEDURE TRAY) ×4 IMPLANT
PACK CYSTO (CUSTOM PROCEDURE TRAY) ×4 IMPLANT
PAD ABD 7.5X8 STRL (GAUZE/BANDAGES/DRESSINGS) IMPLANT
PAD ABD 8X10 STRL (GAUZE/BANDAGES/DRESSINGS) ×4 IMPLANT
POSITIONER SURGICAL ARM (MISCELLANEOUS) ×8 IMPLANT
PROBE LITHOCLAST ULTRA 3.8X403 (UROLOGICAL SUPPLIES) ×4 IMPLANT
PROBE PNEUMATIC 1.0MMX570MM (UROLOGICAL SUPPLIES) ×4 IMPLANT
SET IRRIG Y TYPE TUR BLADDER L (SET/KITS/TRAYS/PACK) IMPLANT
SET WARMING FLUID IRRIGATION (MISCELLANEOUS) ×8 IMPLANT
SHEATH PEELAWAY SET 9 (SHEATH) ×4 IMPLANT
SPONGE GAUZE 4X4 12PLY (GAUZE/BANDAGES/DRESSINGS) ×4 IMPLANT
SPONGE LAP 4X18 X RAY DECT (DISPOSABLE) ×4 IMPLANT
STONE CATCHER W/TUBE ADAPTER (UROLOGICAL SUPPLIES) ×4 IMPLANT
SUT SILK 2 0 30  PSL (SUTURE) ×2
SUT SILK 2 0 30 PSL (SUTURE) ×2 IMPLANT
SYR 20CC LL (SYRINGE) ×8 IMPLANT
SYRINGE 10CC LL (SYRINGE) ×4 IMPLANT
TOWEL OR 17X26 10 PK STRL BLUE (TOWEL DISPOSABLE) ×8 IMPLANT
TOWEL OR NON WOVEN STRL DISP B (DISPOSABLE) ×4 IMPLANT
TRAY FOLEY CATH 14FRSI W/METER (CATHETERS) ×4 IMPLANT
TUBING CONNECTING 10 (TUBING) ×9 IMPLANT
TUBING CONNECTING 10' (TUBING) ×3
WATER STERILE IRR 1500ML POUR (IV SOLUTION) IMPLANT

## 2013-10-24 NOTE — Procedures (Signed)
Successful placement of 4Fr micropuncture venous catheter to left brachial vein. No complications. Ready for use.  Ascencion Dike PA-C Interventional Radiology 10/24/2013 9:18 AM

## 2013-10-24 NOTE — H&P (Signed)
Plan antegrade nephroureteral catheter under fluoro. Discussed procedure, risks, etc with pt and sister.

## 2013-10-24 NOTE — Interval H&P Note (Signed)
History and Physical Interval Note:  10/24/2013 10:45 AM  Kristy Washington  has presented today for surgery, with the diagnosis of RIGHT STAGHORN STONE  The various methods of treatment have been discussed with the patient and family. After consideration of risks, benefits and other options for treatment, the patient has consented to  Procedure(s): NEPHROLITHOTOMY PERCUTANEOUS (Right) HOLMIUM LASER APPLICATION (Right) as a surgical intervention .  The patient's history has been reviewed, patient examined, no change in status, stable for surgery.  I have reviewed the patient's chart and labs.  Questions were answered to the patient's satisfaction.     Xandra Laramee S

## 2013-10-24 NOTE — Anesthesia Preprocedure Evaluation (Signed)
Anesthesia Evaluation  Patient identified by MRN, date of birth, ID band Patient awake    Reviewed: Allergy & Precautions, H&P , NPO status , Patient's Chart, lab work & pertinent test results  Airway Mallampati: II TM Distance: >3 FB Neck ROM: Full    Dental no notable dental hx.    Pulmonary shortness of breath,  breath sounds clear to auscultation  Pulmonary exam normal       Cardiovascular hypertension, Pt. on medications and Pt. on home beta blockers negative cardio ROS  + dysrhythmias + pacemaker Rhythm:Regular Rate:Normal     Neuro/Psych negative neurological ROS  negative psych ROS   GI/Hepatic negative GI ROS, Neg liver ROS,   Endo/Other  negative endocrine ROSdiabetes, Type 2, Oral Hypoglycemic Agents  Renal/GU Renal disease  negative genitourinary   Musculoskeletal negative musculoskeletal ROS (+)   Abdominal   Peds negative pediatric ROS (+)  Hematology negative hematology ROS (+)   Anesthesia Other Findings   Reproductive/Obstetrics negative OB ROS                           Anesthesia Physical Anesthesia Plan  ASA: II  Anesthesia Plan: General   Post-op Pain Management:    Induction: Intravenous  Airway Management Planned: Oral ETT  Additional Equipment:   Intra-op Plan:   Post-operative Plan: Extubation in OR  Informed Consent: I have reviewed the patients History and Physical, chart, labs and discussed the procedure including the risks, benefits and alternatives for the proposed anesthesia with the patient or authorized representative who has indicated his/her understanding and acceptance.   Dental advisory given  Plan Discussed with: CRNA  Anesthesia Plan Comments:         Anesthesia Quick Evaluation

## 2013-10-24 NOTE — Op Note (Signed)
Preoperative diagnosis: Multiple left renal calculi including partial staghorn Postoperative diagnosis: Same  Procedure: Percutaneous nephrolithotomy   Surgeon: [name] M.D.  Anesthesia: Gen.  Indications: Ms. Kristy Washington recently developed gross hematuria. She underwent CT imaging and more at hospital which revealed a 3 cm stone at the ureteropelvic junction filling the renal pelvis and causing mild obstruction. The patient had additional stones in the lower pole calyx. The patient was felt to have 2 large stone for ESWL or ureteroscopy. The urologist in [place] did not perform percutaneous nephrolithotomy and therefore referred the patient to our clinic. The patient does have a cardiac history and is on anticoagulation. He received perioperative clearance from both a cardiac standpoint as well as permission to discontinue anticoagulation for a safe period of time for the procedure. The patient has been seen by interventional radiology and placement of a nephrostomy tube without difficulty was performed this morning. She presents now for definitive management.     Technique and findings: Patient was brought the operating room or she had successful induction general endotracheal anesthesia. She was then carefully placed in the prone position making sure all extremities were carefully padded. Interventional radiology to perform placement of a guidewire and second safety wire to the bladder. They then dilated the nephrostomy tract and placed the access sheath. Multiple stones in the lower pole calyx were encountered. 4 separate stones were removed with a grasper. These range from about 5-8 mm in size. A very large dense stone was encountered in the renal pelvis. Estimated size approximately 3 x 3 cm. We used a combination ultrasonic and pneumatic lithotriptor to break the stone up slowly into numerous pieces which were then extracted. No definitive large pieces were noted at the completion of the procedure.  Over the guidewire I placed a 16 [place] council Foley catheter to use as a nephrostomy tube. Contrast went right down the ureter and there did not appear to be evidence of obvious extravasation. The patient had no obvious interoperative complications. Her nephrostomy tube was secured to the skin. Foley catheter will be left indwelling. She was brought to recovery room in stable condition having had no obvious complications or problems.

## 2013-10-24 NOTE — H&P (Signed)
HPI: Kitti Kincade is an 73 y.o. female with right renal staghorn calculus. She is scheduled for PCNL today and she presents to IR for placement of nephrostomy access catheter. PMHx and meds reviewed.   Past Medical History:  Past Medical History  Diagnosis Date  . History of kidney stones     RIGHT STAGHORN CALCULUS AND ADDITIONAL SCATTERED SMALLER RIGHT KIDNEY LOWER POLE CALCULI  . Bilateral renal cysts     SMALL - PER CT ABDOMEN REPORT 08/26/13  . DDD (degenerative disc disease), lumbar   . Hepatic cyst     "SCATTERED HEPATIC CYSTS HAVE SOME COMPLEX ELEMENTS" -PER CT ABD REPORT 08/26/13  . Diabetes mellitus without complication   . Dysrhythmia     ATRIAL FIB - TAKES XARELTO  . Bell's palsy 2009  . Shingles 2009  . Mitral valve regurgitation     MILD  . Tricuspid regurgitation     MILD - MOSERATE  . PFO (patent foramen ovale)     SMALL PFO  . Left ventricular hypertrophy   . Hypertension     PULMONARY HYPERTENSION  . Shortness of breath     ONLY WITH INCLINES  . H/O seasonal allergies   . Hearing loss in left ear     HEARING AID  . Pacemaker 11/02/2007    FOR SICK SINUS SYNDROME/ TACHY BRADY SYNDROME    Past Surgical History:  Past Surgical History  Procedure Laterality Date  . Eye surgery      BILATERAL CATARACT EXTRACTIONS WITH LENS IMPLANTS  . Cholecystectomy    . Appendectomy    . Surgery for tubal pregnancy      Family History: No family history on file.  Social History:  reports that she has never smoked. She has never used smokeless tobacco. She reports that she does not drink alcohol or use illicit drugs.  Allergies:  Allergies  Allergen Reactions  . Actos [Pioglitazone] Other (See Comments)    Fluid retention   . Adhesive [Tape] Rash    Medications:   Medication List    Notice   This visit is during an admission. Changes to the med list made in this visit will be reflected in the After Visit Summary of the admission.      Please HPI for  pertinent positives, otherwise complete 10 system ROS negative.  Physical Exam: BP 144/65  Pulse 72  Temp(Src) 98.2 F (36.8 C) (Oral)  Resp 16  SpO2 99% There is no weight on file to calculate BMI.   General Appearance:  Alert, cooperative, no distress, appears stated age  Head:  Normocephalic, without obvious abnormality, atraumatic  ENT: Unremarkable  Neck: Supple, symmetrical, trachea midline  Lungs:   Clear to auscultation bilaterally, no w/r/r.  Chest Wall:  No tenderness or deformity  Heart:  Regular rate and rhythm, S1, S2 normal, no murmur, rub or gallop.  Abdomen:   Soft, non-tender, non distended.  Neurologic: Normal affect, no gross deficits.   CBC    Component Value Date/Time   WBC 7.8 10/20/2013 1010   RBC 4.47 10/20/2013 1010   HGB 12.5 10/20/2013 1010   HCT 37.9 10/20/2013 1010   PLT 356 10/20/2013 1010   MCV 84.8 10/20/2013 1010   MCH 28.0 10/20/2013 1010   MCHC 33.0 10/20/2013 1010   RDW 14.0 10/20/2013 1010    BMET    Component Value Date/Time   NA 141 10/20/2013 1010   K 4.3 10/20/2013 1010   CL 101 10/20/2013 1010  CO2 25 10/20/2013 1010   GLUCOSE 104* 10/20/2013 1010   BUN 10 10/20/2013 1010   CREATININE 0.72 10/20/2013 1010   CALCIUM 9.8 10/20/2013 1010   GFRNONAA 84* 10/20/2013 1010   GFRAA >90 10/20/2013 1010    Prothrombin Time/INR 15.3/1.24  Assessment/Plan Right renal staghorn calculus For IR placed PCN followed by PCNL Discussed procedure, risks, complications, use of sedation Labs reviewed, ok Consent signed in chart  Ascencion Dike PA-C 10/24/2013, 8:10 AM

## 2013-10-24 NOTE — Procedures (Signed)
74f R antegrade nephroureteral catheter placed under fluoro No complication No blood loss. See complete dictation in [facility].

## 2013-10-24 NOTE — Transfer of Care (Signed)
Immediate Anesthesia Transfer of Care Note  Patient: Kristy Washington  Procedure(s) Performed: Procedure(s): NEPHROLITHOTOMY PERCUTANEOUS (Right)  Patient Location: PACU  Anesthesia Type:General  Level of Consciousness: awake, alert , oriented and patient cooperative  Airway & Oxygen Therapy: Patient Spontanous Breathing and Patient connected to face mask oxygen  Post-op Assessment: Report given to PACU RN and Post -op Vital signs reviewed and stable  Post vital signs: Reviewed and stable  Complications: No apparent anesthesia complications

## 2013-10-24 NOTE — Preoperative (Signed)
Beta Blockers   Reason not to administer Beta Blockers:Not Applicable 

## 2013-10-24 NOTE — Anesthesia Postprocedure Evaluation (Signed)
  Anesthesia Post-op Note  Patient: Kristy Washington  Procedure(s) Performed: Procedure(s) (LRB): NEPHROLITHOTOMY PERCUTANEOUS (Right)  Patient Location: PACU  Anesthesia Type: General  Level of Consciousness: awake and alert   Airway and Oxygen Therapy: Patient Spontanous Breathing  Post-op Pain: mild  Post-op Assessment: Post-op Vital signs reviewed, Patient's Cardiovascular Status Stable, Respiratory Function Stable, Patent Airway and No signs of Nausea or vomiting  Last Vitals:  Filed Vitals:   10/24/13 1511  BP: 133/62  Pulse: 71  Temp: 36.3 C  Resp: 14    Post-op Vital Signs: stable   Complications: No apparent anesthesia complications

## 2013-10-25 ENCOUNTER — Encounter (HOSPITAL_COMMUNITY): Payer: Self-pay | Admitting: Urology

## 2013-10-25 ENCOUNTER — Observation Stay (HOSPITAL_COMMUNITY): Payer: Medicare Other

## 2013-10-25 LAB — BASIC METABOLIC PANEL
BUN: 12 mg/dL (ref 6–23)
CHLORIDE: 98 meq/L (ref 96–112)
CO2: 23 mEq/L (ref 19–32)
Calcium: 8.7 mg/dL (ref 8.4–10.5)
Creatinine, Ser: 0.78 mg/dL (ref 0.50–1.10)
GFR calc non Af Amer: 82 mL/min — ABNORMAL LOW (ref >90)
Glucose, Bld: 150 mg/dL — ABNORMAL HIGH (ref 70–99)
Potassium: 4 mEq/L (ref 3.7–5.3)
Sodium: 135 mEq/L — ABNORMAL LOW (ref 137–147)

## 2013-10-25 LAB — GLUCOSE, CAPILLARY
GLUCOSE-CAPILLARY: 156 mg/dL — AB (ref 70–99)
Glucose-Capillary: 177 mg/dL — ABNORMAL HIGH (ref 70–99)
Glucose-Capillary: 151 mg/dL — ABNORMAL HIGH (ref 70–99)
Glucose-Capillary: 191 mg/dL — ABNORMAL HIGH (ref 70–99)

## 2013-10-25 LAB — HEMOGLOBIN AND HEMATOCRIT, BLOOD
HEMATOCRIT: 30.5 % — AB (ref 36.0–46.0)
Hemoglobin: 10.3 g/dL — ABNORMAL LOW (ref 12.0–15.0)

## 2013-10-25 MED ORDER — HEPARIN SODIUM (PORCINE) 5000 UNIT/ML IJ SOLN
5000.0000 [IU] | Freq: Three times a day (TID) | INTRAMUSCULAR | Status: DC
  Administered 2013-10-25 – 2013-10-26 (×3): 5000 [IU] via SUBCUTANEOUS
  Filled 2013-10-25 (×6): qty 1

## 2013-10-25 MED ORDER — PHENOL 1.4 % MT LIQD
1.0000 | OROMUCOSAL | Status: DC | PRN
  Administered 2013-10-25: 1 via OROMUCOSAL
  Filled 2013-10-25: qty 177

## 2013-10-25 MED ORDER — MENTHOL 3 MG MT LOZG
1.0000 | LOZENGE | OROMUCOSAL | Status: DC | PRN
  Filled 2013-10-25: qty 9

## 2013-10-25 MED ORDER — PROMETHAZINE HCL 25 MG/ML IJ SOLN
12.5000 mg | Freq: Once | INTRAMUSCULAR | Status: AC
  Administered 2013-10-25: 12.5 mg via INTRAVENOUS
  Filled 2013-10-25: qty 1

## 2013-10-25 NOTE — Progress Notes (Signed)
UR completed 

## 2013-10-25 NOTE — Care Management Note (Addendum)
    Page 1 of 1   10/26/2013     11:07:30 AM   CARE MANAGEMENT NOTE 10/26/2013  Patient:  Kristy Washington,Kristy Washington   Account Number:  [id number]  Date Initiated:  10/25/2013  Documentation initiated by:  Dessa Phi  Subjective/Objective Assessment:   73 Y/O F ADMITTED W/R STAGHORN CALCULUS.     Action/Plan:   FROM HOME.HAS PCP,PHARMACY.   Anticipated DC Date:  10/26/2013   Anticipated DC Plan:  [place]  CM consult      Choice offered to / List presented to:             Status of service:  Completed, signed off Medicare Important Message given?   (If response is "NO", the following Medicare IM given date fields will be blank) Date Medicare IM given:   Date Additional Medicare IM given:    Discharge Disposition:  HOME/SELF CARE  Per UR Regulation:  Reviewed for med. necessity/level of care/duration of stay  If discussed at [place] of Stay Meetings, dates discussed:    Comments:  10/25/13 Margia Wiesen RN,BSN NCM [phone] POD#1 R PCN.NO ANTICIPATED D/C NEEDS.

## 2013-10-25 NOTE — Progress Notes (Signed)
1 Day Post-Op Subjective: Patient reports feeling sore and having nausea. Urine light pink.  Objective: Vital signs in last 24 hours: Temp:  [97.4 F (36.3 C)-98.4 F (36.9 C)] 98.4 F (36.9 C) (03/17 0443) Pulse Rate:  [71-81] 81 (03/17 0443) Resp:  [9-21] 18 (03/17 0443) BP: (111-144)/(46-89) 115/48 mmHg (03/17 0443) SpO2:  [92 %-100 %] 97 % (03/17 0443) Weight:  [161 lb (73.029 kg)] 161 lb (73.029 kg) (03/16 1528)  Intake/Output from previous day: 03/16 0701 - 03/17 0700 In: 1672.5 [I.V.:1222.5; IV Piggyback:100] Out: 560 [Urine:560] Intake/Output this shift:    Physical Exam:  Constitutional: Vital signs reviewed. WD WN in NAD   Eyes: PERRL, No scleral icterus.   Cardiovascular: RRR Pulmonary/Chest: Normal effort Abdominal: Soft. Non-tender, non-distended, bowel sounds are normal, no masses, organomegaly, or guarding present.  Genitourinary:light pink urine NT and Foley Extremities: No cyanosis or edema   Lab Results:  Recent Labs  10/25/13 0435  HGB 10.3*  HCT 30.5*   BMET  Recent Labs  10/25/13 0435  NA 135*  K 4.0  CL 98  CO2 23  GLUCOSE 150*  BUN 12  CREATININE 0.78  CALCIUM 8.7   No results found for this basename: LABPT, INR,  in the last 72 hours No results found for this basename: LABURIN,  in the last 72 hours No results found for this or any previous visit.  Studies/Results: Ir Dil Ureter Right  10/24/2013   CLINICAL DATA:  Symptomatic right nephrolithiasis, planned percutaneous nephrolithotomy  EXAM: ANTEGRADE RIGHT PERCUTANEOUS NEPHROURETERAL CATHETER PLACEMENT UNDER FLUOROSCOPIC GUIDANCE  FLUOROSCOPY TIME:  3 MIN 12 seconds  TECHNIQUE: The procedure, risks (including but not limited to bleeding, infection, organ damage ), benefits, and alternatives were explained to the patient. Questions regarding the procedure were encouraged and answered. The patient understands and consents to the procedure. The rightflank region prepped with Betadine,  draped in usual sterile fashion, infiltrated locally with 1% lidocaine.As antibiotic prophylaxis, cefazolin 2 g was ordered pre-procedure and administered intravenously within one hour of incision.  Intravenous Fentanyl and Versed were administered as conscious sedation during continuous cardiorespiratory monitoring by the radiology RN, with a total moderate sedation time of less than 30 minutes.  Under fluoroscopic guidance, a 21-gauge trocar needle was advanced into a posterior lower pole calyx using the radiodense calculus as a target. Needle was exchanged over a guidewire for 4 [place] dilator. Contrast injection confirmed appropriate positioning. Catheter was exchanged over a guidewire for the transitional dilator, through which an angled Glidewire was advanced down the ureter into the urinary bladder. Over this, a 5 [place] angiographic catheter was placed into the urinary bladder, capped externally. Catheter secured externally . No immediate complication.  IMPRESSION: 1. Technically successful antegrade right percutaneous nephroureteral catheter placement.   Electronically Signed   By: Arne Cleveland M.D.   On: 10/24/2013 11:26   Ir Venipuncture 3yr/older By Md  10/24/2013   CLINICAL DATA:  Poor venous access  EXAM: ULTRASOUND GUIDED VENIPUNCTURE BY MD  CONTRAST:  None  COMPLICATIONS: None immediate  TECHNIQUE: The left upper arm was prepped with chlorhexidine in a sterile fashion, and a sterile drape was applied covering the operative field. Local anesthesia was provided with 1% lidocaine.  Under direct ultrasound guidance, the left brachial vein was accessed with a micropuncture kit after the overlying soft tissues were anesthetized with 1% lidocaine. An ultrasound image was saved for documentation purposes. The 4 [place] sheath easily aspirated and flushed and was secured in place. A  dressing was placed. The patient tolerated the procedure well without immediate post procedural  complication.  IMPRESSION: Successful ultrasound guided placement of a left brachial vein approach micropuncture sheath for temporary venous access.  Read by Ascencion Dike PA-C   Electronically Signed   By: Arne Cleveland M.D.   On: 10/24/2013 13:01   Ir US Guide Vasc Access Left  10/24/2013   CLINICAL DATA:  Poor venous access  EXAM: ULTRASOUND GUIDED VENIPUNCTURE BY MD  CONTRAST:  None  COMPLICATIONS: None immediate  TECHNIQUE: The left upper arm was prepped with chlorhexidine in a sterile fashion, and a sterile drape was applied covering the operative field. Local anesthesia was provided with 1% lidocaine.  Under direct ultrasound guidance, the left brachial vein was accessed with a micropuncture kit after the overlying soft tissues were anesthetized with 1% lidocaine. An ultrasound image was saved for documentation purposes. The 4 [place] micropuncture sheath easily aspirated and flushed and was secured in place. A dressing was placed. The patient tolerated the procedure well without immediate post procedural complication.  IMPRESSION: Successful ultrasound guided placement of a left brachial vein approach micropuncture sheath for temporary venous access.  Read by Ascencion Dike PA-C   Electronically Signed   By: Arne Cleveland M.D.   On: 10/24/2013 13:01   Dg C-arm 61-120 Min-no Report  10/24/2013   CLINICAL DATA: Percutaneous Nephrolithotomy   C-ARM 61-120 MINUTES  Fluoroscopy was utilized by the requesting physician.  No radiographic  interpretation.    Ir Oris Drone Cath Perc Right  10/24/2013   CLINICAL DATA:  Symptomatic right nephrolithiasis, planned percutaneous nephrolithotomy  EXAM: ANTEGRADE RIGHT PERCUTANEOUS NEPHROURETERAL CATHETER PLACEMENT UNDER FLUOROSCOPIC GUIDANCE  FLUOROSCOPY TIME:  3 MIN 12 seconds  TECHNIQUE: The procedure, risks (including but not limited to bleeding, infection, organ damage ), benefits, and alternatives were explained to the patient. Questions regarding the  procedure were encouraged and answered. The patient understands and consents to the procedure. The rightflank region prepped with Betadine, draped in usual sterile fashion, infiltrated locally with 1% lidocaine.As antibiotic prophylaxis, cefazolin 2 g was ordered pre-procedure and administered intravenously within one hour of incision.  Intravenous Fentanyl and Versed were administered as conscious sedation during continuous cardiorespiratory monitoring by the radiology RN, with a total moderate sedation time of less than 30 minutes.  Under fluoroscopic guidance, a 21-gauge trocar needle was advanced into a posterior lower pole calyx using the radiodense calculus as a target. Needle was exchanged over a guidewire for 4 [place] dilator. Contrast injection confirmed appropriate positioning. Catheter was exchanged over a guidewire for the transitional dilator, through which an angled Glidewire was advanced down the ureter into the urinary bladder. Over this, a 5 [place] angiographic catheter was placed into the urinary bladder, capped externally. Catheter secured externally . No immediate complication.  IMPRESSION: 1. Technically successful antegrade right percutaneous nephroureteral catheter placement.   Electronically Signed   By: Arne Cleveland M.D.   On: 10/24/2013 11:26    Assessment/Plan:   Doing well  Zofran this AM  Plug NT  D/C foley  CT scan  Probable D/C later todaty   LOS: 1 day   Jaylin Benzel S 10/25/2013, 7:53 AM

## 2013-10-26 LAB — CBC
HCT: 28.8 % — ABNORMAL LOW (ref 36.0–46.0)
Hemoglobin: 9.4 g/dL — ABNORMAL LOW (ref 12.0–15.0)
MCH: 28 pg (ref 26.0–34.0)
MCHC: 32.6 g/dL (ref 30.0–36.0)
MCV: 85.7 fL (ref 78.0–100.0)
PLATELETS: 176 10*3/uL (ref 150–400)
RBC: 3.36 MIL/uL — AB (ref 3.87–5.11)
RDW: 14 % (ref 11.5–15.5)
WBC: 11.4 10*3/uL — AB (ref 4.0–10.5)

## 2013-10-26 LAB — BASIC METABOLIC PANEL
BUN: 6 mg/dL (ref 6–23)
CO2: 24 mEq/L (ref 19–32)
CREATININE: 0.71 mg/dL (ref 0.50–1.10)
Calcium: 9 mg/dL (ref 8.4–10.5)
Chloride: 102 mEq/L (ref 96–112)
GFR calc non Af Amer: 84 mL/min — ABNORMAL LOW (ref >90)
Glucose, Bld: 187 mg/dL — ABNORMAL HIGH (ref 70–99)
POTASSIUM: 3.5 meq/L — AB (ref 3.7–5.3)
Sodium: 137 mEq/L (ref 137–147)

## 2013-10-26 LAB — GLUCOSE, CAPILLARY: GLUCOSE-CAPILLARY: 162 mg/dL — AB (ref 70–99)

## 2013-10-26 MED ORDER — HYDROCODONE-ACETAMINOPHEN 5-325 MG PO TABS: 1.0000 | ORAL_TABLET | ORAL | Status: DC | PRN

## 2013-10-26 NOTE — Discharge Instructions (Signed)
DISCHARGE INSTRUCTIONS FOR [facility]  MEDICATIONS:  1. DO NOT RESUME YOUR IBUPROFEN, or any other medicines like aspirin, motrin, excedrin, advil, aleve, vitamin E, fish oil as these can all cause bleeding x 10 days.  2. Resume all your other meds from home - except do not take any other pain meds that you G[place]ens have at home. ACTIVITY 1. No strenuous activity, sexual activity, or lifting greater than 10 pounds for 3 weeks. 2. No driving while on narcotic pain medications 3. Drink plenty of water 4. Continue to walk at home - you can still get blood clots when you are at home, so keep active, but don't over do it. 5. [place] return to work in 1 week (but not heavy or strenuous activity).  BATHING 1. You can shower and we recommend daily showers.  Cover your wound with a dressing and remove the dressing immediately after the shower.  Do not submerge wound under water.  WOUND CARE Your wound will drain bloody fluid and Sills do so for 7-14 days. You have 2 options for dressings:  1. You [place] use kerlex (rolled up gauze) and tape to dress your wound.  If you choose this method, then change the dressing as it becomes soaked.  Change it at least once daily until it stops draining. 2. You Zipper use and ostomy device.  This is a bag with an andhesive circle.  The circle has a hole in the middle of it and you cut the hole to the size needed to fit the wound.  This will collect the drainage in the bag and allow you to drain the bag as needed.  SIGNS/SYMPTOMS TO CALL: 1. Please call us if you have a fever greater than 101.5, uncontrolled nausea/vomiting, uncontrolled pain, dizziness, unable to urinate, bloody urine, chest pain, shortness of breath, leg swelling, leg pain, redness around wound, drainage from wound, or any other concerns or questions. 2. You can reach [place] at [phone]. FOLLOW-UP 1.  You will have a follow up appointment  3-31 at 1015 am Restart aspirin and Xarelto on Thursday 3-19

## 2013-10-26 NOTE — Discharge Summary (Signed)
Patient ID: Kristy Washington MRN: [id number] DOB/AGE: 1940/12/27 73 y.o.  Admit date: 10/24/2013 Discharge date: 10/26/2013  Condition on discharge: Good  Discharge Diagnoses:   Present on Admission:  . Staghorn calculus  Consults:  None    Discharge Medications:   Medication List    STOP taking these medications       aspirin EC 81 MG tablet     XARELTO 20 MG Tabs tablet  Generic drug:  Rivaroxaban      TAKE these medications       amLODipine 2.5 MG tablet  Commonly known as:  NORVASC  Take 2.5 mg by mouth every evening.     CALCIUM-VITAMIN D PO  Take 1 tablet by mouth daily.     ciprofloxacin 250 MG tablet  Commonly known as:  CIPRO  Take 250 mg by mouth 2 (two) times daily. STARTED Monday 10/17/13 TWICE A DAY UNTIL SURGERY DATE     diltiazem 240 MG 24 hr capsule  Commonly known as:  CARDIZEM CD  Take 240 mg by mouth every morning.     Fish Oil 1000 MG Caps  Take 1,000 mg by mouth daily.     HYDROcodone-acetaminophen 5-325 MG per tablet  Commonly known as:  NORCO/VICODIN  Take 1-2 tablets by mouth every 4 (four) hours as needed for moderate pain.     lisinopril 20 MG tablet  Commonly known as:  PRINIVIL,ZESTRIL  Take 20 mg by mouth 2 (two) times daily.     metFORMIN 1000 MG tablet  Commonly known as:  GLUCOPHAGE  Take 500 mg by mouth 2 (two) times daily with a meal.     metoprolol 50 MG tablet  Commonly known as:  LOPRESSOR  Take 50 mg by mouth 2 (two) times daily.     potassium chloride SA 20 MEQ tablet  Commonly known as:  K-DUR,KLOR-CON  Take 20 mEq by mouth daily.     simvastatin 5 MG tablet  Commonly known as:  ZOCOR  Take 5 mg by mouth at bedtime.     sotalol 80 MG tablet  Commonly known as:  BETAPACE  Take 80-120 mg by mouth 2 (two) times daily. 130m in the am and 80 mg in the pm         Significant Diagnostic Studies:  Ct Abdomen Wo Contrast  10/25/2013   CLINICAL DATA:  Status post kidney stones surgery  EXAM: CT ABDOMEN WITHOUT CONTRAST   TECHNIQUE: Multidetector CT imaging of the abdomen was performed following the standard protocol without IV contrast.  COMPARISON:  08/26/2013  FINDINGS: Atelectasis is noted within both lung bases posteriorly. There is no pleural or pericardial effusion. The heart size appears moderately enlarged. There are multiple foci of low attenuation within the liver compatible with cysts. Previous cholecystectomy. No biliary dilatation. Normal appearance of the pancreas. The spleen is negative.  The adrenal glands are both normal. Cyst stress set similar appearance of the left kidney with small cortical cyst arising from the inferior pole. There is no left-sided nephrolithiasis or hydronephrosis. There has been interval fragmentation and surgical removal much of the right renal calculi. There are still stone fragments identified within the right renal collecting system. The largest is in the upper pole measuring 9 mm, image 28/ series 2. There is right-sided hydronephrosis and hydroureter. Gas is identified within the right renal collecting system which is likely related to recent intervention. Right-sided perinephric and periureteral fat stranding is identified. There is no significant perinephric fluid collection identified to  suggest hematoma or urinoma. A surgical drainage catheter is in place with tip terminating adjacent to the inferior pole of the right kidney.  Mild calcified atherosclerotic disease affects the abdominal aorta. There is no aneurysm.  The stomach appears normal. The small bowel loops have a normal course and caliber and there is no evidence for obstruction. Normal appearance of the colon.  Review of the visualized bony structures is on unremarkable.  IMPRESSION: 1. Status post percutaneous nephrolithotomy. Although much of the right renal calculi been removed there remains several stone fragments, the largest of which is in the upper pole. 2. Right-sided hydronephrosis and hydroureter is noted. 3. No  evidence for perinephric fluid collection.   Electronically Signed   By: Kerby Moors M.D.   On: 10/25/2013 11:46   Ir Dil Ureter Right  10/24/2013   CLINICAL DATA:  Symptomatic right nephrolithiasis, planned percutaneous nephrolithotomy  EXAM: ANTEGRADE RIGHT PERCUTANEOUS NEPHROURETERAL CATHETER PLACEMENT UNDER FLUOROSCOPIC GUIDANCE  FLUOROSCOPY TIME:  3 MIN 12 seconds  TECHNIQUE: The procedure, risks (including but not limited to bleeding, infection, organ damage ), benefits, and alternatives were explained to the patient. Questions regarding the procedure were encouraged and answered. The patient understands and consents to the procedure. The rightflank region prepped with Betadine, draped in usual sterile fashion, infiltrated locally with 1% lidocaine.As antibiotic prophylaxis, cefazolin 2 g was ordered pre-procedure and administered intravenously within one hour of incision.  Intravenous Fentanyl and Versed were administered as conscious sedation during continuous cardiorespiratory monitoring by the radiology RN, with a total moderate sedation time of less than 30 minutes.  Under fluoroscopic guidance, a 21-gauge trocar needle was advanced into a posterior lower pole calyx using the radiodense calculus as a target. Needle was exchanged over a guidewire for 4 [place] dilator. Contrast injection confirmed appropriate positioning. Catheter was exchanged over a guidewire for the transitional dilator, through which an angled Glidewire was advanced down the ureter into the urinary bladder. Over this, a 5 [place] angiographic catheter was placed into the urinary bladder, capped externally. Catheter secured externally . No immediate complication.  IMPRESSION: 1. Technically successful antegrade right percutaneous nephroureteral catheter placement.   Electronically Signed   By: Arne Cleveland M.D.   On: 10/24/2013 11:26   Ir Venipuncture 76yr/older By Md  10/24/2013   CLINICAL DATA:  Poor venous access  EXAM:  ULTRASOUND GUIDED VENIPUNCTURE BY MD  CONTRAST:  None  COMPLICATIONS: None immediate  TECHNIQUE: The left upper arm was prepped with chlorhexidine in a sterile fashion, and a sterile drape was applied covering the operative field. Local anesthesia was provided with 1% lidocaine.  Under direct ultrasound guidance, the left brachial vein was accessed with a micropuncture kit after the overlying soft tissues were anesthetized with 1% lidocaine. An ultrasound image was saved for documentation purposes. The 4 [place]uncture sheath easily aspirated and flushed and was secured in place. A dressing was placed. The patient tolerated the procedure well without immediate post procedural complication.  IMPRESSION: Successful ultrasound guided placement of a left brachial vein approach micropuncture sheath for temporary venous access.  Read by KAscencion DikePA-C   Electronically Signed   By: DArne ClevelandM.D.   On: 10/24/2013 13:01   Ir [place] Vasc Access Left  10/24/2013   CLINICAL DATA:  Poor venous access  EXAM: ULTRASOUND GUIDED VENIPUNCTURE BY MD  CONTRAST:  None  COMPLICATIONS: None immediate  TECHNIQUE: The left upper arm was prepped with chlorhexidine in a sterile fashion, and a sterile drape  was applied covering the operative field. Local anesthesia was provided with 1% lidocaine.  Under direct ultrasound guidance, the left brachial vein was accessed with a micropuncture kit after the overlying soft tissues were anesthetized with 1% lidocaine. An ultrasound image was saved for documentation purposes. The 4 [place] micropuncture sheath easily aspirated and flushed and was secured in place. A dressing was placed. The patient tolerated the procedure well without immediate post procedural complication.  IMPRESSION: Successful ultrasound guided placement of a left brachial vein approach micropuncture sheath for temporary venous access.  Read by Ascencion Dike PA-C   Electronically Signed   By: Arne Cleveland  M.D.   On: 10/24/2013 13:01   Dg C-arm 61-120 Min-no Report  10/24/2013   CLINICAL DATA: Percutaneous Nephrolithotomy   C-ARM 61-120 MINUTES  Fluoroscopy was utilized by the requesting physician.  No radiographic  interpretation.    Ir Oris Drone Cath Perc Right  10/24/2013   CLINICAL DATA:  Symptomatic right nephrolithiasis, planned percutaneous nephrolithotomy  EXAM: ANTEGRADE RIGHT PERCUTANEOUS NEPHROURETERAL CATHETER PLACEMENT UNDER FLUOROSCOPIC GUIDANCE  FLUOROSCOPY TIME:  3 MIN 12 seconds  TECHNIQUE: The procedure, risks (including but not limited to bleeding, infection, organ damage ), benefits, and alternatives were explained to the patient. Questions regarding the procedure were encouraged and answered. The patient understands and consents to the procedure. The rightflank region prepped with Betadine, draped in usual sterile fashion, infiltrated locally with 1% lidocaine.As antibiotic prophylaxis, cefazolin 2 g was ordered pre-procedure and administered intravenously within one hour of incision.  Intravenous Fentanyl and Versed were administered as conscious sedation during continuous cardiorespiratory monitoring by the radiology RN, with a total moderate sedation time of less than 30 minutes.  Under fluoroscopic guidance, a 21-gauge trocar needle was advanced into a posterior lower pole calyx using the radiodense calculus as a target. Needle was exchanged over a guidewire for 4 [place] dilator. Contrast injection confirmed appropriate positioning. Catheter was exchanged over a guidewire for the transitional dilator, through which an angled Glidewire was advanced down the ureter into the urinary bladder. Over this, a 5 [place] angiographic catheter was placed into the urinary bladder, capped externally. Catheter secured externally . No immediate complication.  IMPRESSION: 1. Technically successful antegrade right percutaneous nephroureteral catheter placement.   Electronically Signed   By: Arne Cleveland M.D.   On: 10/24/2013 11:26      Hospital Course:  Active Problems:   Staghorn calculus  Patient underwent percutaneous nephrolithotomy. Access was provided by interventional radiology. Multiple stones were encountered in the lower pole calyx and a very large 3 cm stone in the renal pelvis. The surgery itself was without incident. CT scan postoperatively showed several small fragments and also an additional 8-9 mm fragment. The nephrostomy tube at that time appear to be out of the collecting system it was removed. The patient had some nausea for postoperative day 1 and was For some port of care. She remained afebrile and was hemodynamically stable. She developed a mild postop blood loss anemia. She was discharged on postoperative day 2 tolerating a general diet well. He was ambulating with minimal to no significant discomfort and no nausea. She will resume her chronic anticoagulation in approximately 48 hours. Day of Discharge BP 108/42  Pulse 81  Temp(Src) 98.7 F (37.1 C) (Oral)  Resp 16  Ht 5' 6"  (1.676 m)  Wt 161 lb (73.029 kg)  BMI 26.00 kg/m2  SpO2 91%  Well-developed well-nourished female in no acute distress. Respiratory effort is normal. Heart is  regular rate and rhythm. Abdomen is soft. Mild CVA tenderness on the right side. Nephrostomy tube site looks fine. Minimal output/drainage. Extremities without edema or tenderness.  Results for orders placed during the hospital encounter of 10/24/13 (from the past 24 hour(s))  GLUCOSE, CAPILLARY     Status: Abnormal   Collection Time    10/25/13 11:45 AM      Result Value Ref Range   Glucose-Capillary 151 (*) 70 - 99 mg/dL  GLUCOSE, CAPILLARY     Status: Abnormal   Collection Time    10/25/13  5:43 PM      Result Value Ref Range   Glucose-Capillary 191 (*) 70 - 99 mg/dL  GLUCOSE, CAPILLARY     Status: Abnormal   Collection Time    10/25/13  9:26 PM      Result Value Ref Range   Glucose-Capillary 156 (*) 70 - 99 mg/dL   BASIC METABOLIC PANEL     Status: Abnormal   Collection Time    10/26/13  5:35 AM      Result Value Ref Range   Sodium 137  137 - 147 mEq/L   Potassium 3.5 (*) 3.7 - 5.3 mEq/L   Chloride 102  96 - 112 mEq/L   CO2 24  19 - 32 mEq/L   Glucose, Bld 187 (*) 70 - 99 mg/dL   BUN 6  6 - 23 mg/dL   Creatinine, Ser 0.71  0.50 - 1.10 mg/dL   Calcium 9.0  8.4 - 10.5 mg/dL   GFR calc non Af Amer 84 (*) >90 mL/min   GFR calc Af Amer >90  >90 mL/min  CBC     Status: Abnormal   Collection Time    10/26/13  5:35 AM      Result Value Ref Range   WBC 11.4 (*) 4.0 - 10.5 K/uL   RBC 3.36 (*) 3.87 - 5.11 MIL/uL   Hemoglobin 9.4 (*) 12.0 - 15.0 g/dL   HCT 28.8 (*) 36.0 - 46.0 %   MCV 85.7  78.0 - 100.0 fL   MCH 28.0  26.0 - 34.0 pg   MCHC 32.6  30.0 - 36.0 g/dL   RDW 14.0  11.5 - 15.5 %   Platelets 176  150 - 400 K/uL  GLUCOSE, CAPILLARY     Status: Abnormal   Collection Time    10/26/13  7:23 AM      Result Value Ref Range   Glucose-Capillary 162 (*) 70 - 99 mg/dL   Comment 1 Notify RN     Comment 2 Documented in Chart

## 2013-12-29 ENCOUNTER — Other Ambulatory Visit: Payer: Self-pay | Admitting: Urology

## 2013-12-30 ENCOUNTER — Encounter (HOSPITAL_COMMUNITY): Payer: Self-pay | Admitting: Pharmacy Technician

## 2014-01-05 ENCOUNTER — Encounter (HOSPITAL_COMMUNITY): Payer: Self-pay

## 2014-01-09 ENCOUNTER — Ambulatory Visit (HOSPITAL_COMMUNITY): Payer: Medicare Other

## 2014-01-09 ENCOUNTER — Encounter (HOSPITAL_COMMUNITY): Payer: Self-pay | Admitting: General Practice

## 2014-01-09 ENCOUNTER — Encounter (HOSPITAL_COMMUNITY): Payer: Medicare Other | Admitting: Certified Registered Nurse Anesthetist

## 2014-01-09 ENCOUNTER — Ambulatory Visit (HOSPITAL_COMMUNITY): Payer: Medicare Other | Admitting: Certified Registered Nurse Anesthetist

## 2014-01-09 ENCOUNTER — Encounter (HOSPITAL_COMMUNITY)
Admission: RE | Disposition: A | Discharge status: Home / Self Care | Payer: Self-pay | Source: Ambulatory Visit | Attending: Urology

## 2014-01-09 ENCOUNTER — Ambulatory Visit (HOSPITAL_COMMUNITY)
Admission: RE | Admit: 2014-01-09 | Discharge: 2014-01-09 | Disposition: A | Discharge status: Home / Self Care | Payer: Medicare Other | Source: Ambulatory Visit | Attending: Urology | Admitting: Urology

## 2014-01-09 DIAGNOSIS — Z950 Presence of cardiac pacemaker: Secondary | ICD-10-CM | POA: Insufficient documentation

## 2014-01-09 DIAGNOSIS — N200 Calculus of kidney: Secondary | ICD-10-CM | POA: Insufficient documentation

## 2014-01-09 DIAGNOSIS — E119 Type 2 diabetes mellitus without complications: Secondary | ICD-10-CM | POA: Insufficient documentation

## 2014-01-09 DIAGNOSIS — Z79899 Other long term (current) drug therapy: Secondary | ICD-10-CM | POA: Insufficient documentation

## 2014-01-09 DIAGNOSIS — I10 Essential (primary) hypertension: Secondary | ICD-10-CM | POA: Insufficient documentation

## 2014-01-09 LAB — GLUCOSE, CAPILLARY: Glucose-Capillary: 127 mg/dL — ABNORMAL HIGH (ref 70–99)

## 2014-01-09 SURGERY — LITHOTRIPSY, ESWL
Anesthesia: Monitor Anesthesia Care | Laterality: Right

## 2014-01-09 MED ORDER — DIPHENHYDRAMINE HCL 25 MG PO CAPS: 25.0000 mg | ORAL_CAPSULE | ORAL | Status: DC

## 2014-01-09 MED ORDER — PROMETHAZINE HCL 25 MG/ML IJ SOLN: 6.2500 mg | INTRAMUSCULAR | Status: DC | PRN

## 2014-01-09 MED ORDER — ONDANSETRON HCL 4 MG/2ML IJ SOLN
INTRAMUSCULAR | Status: DC | PRN
  Administered 2014-01-09: 4 mg via INTRAVENOUS

## 2014-01-09 MED ORDER — PROPOFOL 10 MG/ML IV BOLUS
INTRAVENOUS | Status: AC
  Filled 2014-01-09: qty 20

## 2014-01-09 MED ORDER — LIDOCAINE HCL (CARDIAC) 20 MG/ML IV SOLN
INTRAVENOUS | Status: AC
  Filled 2014-01-09: qty 5

## 2014-01-09 MED ORDER — MIDAZOLAM HCL 2 MG/2ML IJ SOLN
INTRAMUSCULAR | Status: AC
  Filled 2014-01-09: qty 2

## 2014-01-09 MED ORDER — PROPOFOL INFUSION 10 MG/ML OPTIME
INTRAVENOUS | Status: DC | PRN
  Administered 2014-01-09: 50 ug/kg/min via INTRAVENOUS

## 2014-01-09 MED ORDER — CIPROFLOXACIN HCL 500 MG PO TABS
500.0000 mg | ORAL_TABLET | ORAL | Status: AC
  Administered 2014-01-09: 500 mg via ORAL
  Filled 2014-01-09: qty 1

## 2014-01-09 MED ORDER — HYDROCODONE-ACETAMINOPHEN 5-325 MG PO TABS: 1.0000 | ORAL_TABLET | Freq: Four times a day (QID) | ORAL | Status: AC | PRN

## 2014-01-09 MED ORDER — MIDAZOLAM HCL 5 MG/5ML IJ SOLN
INTRAMUSCULAR | Status: DC | PRN
  Administered 2014-01-09: 0.5 mg via INTRAVENOUS

## 2014-01-09 MED ORDER — ONDANSETRON HCL 4 MG/2ML IJ SOLN
INTRAMUSCULAR | Status: AC
  Filled 2014-01-09: qty 2

## 2014-01-09 MED ORDER — FENTANYL CITRATE 0.05 MG/ML IJ SOLN
INTRAMUSCULAR | Status: DC | PRN
  Administered 2014-01-09: 25 ug via INTRAVENOUS

## 2014-01-09 MED ORDER — FENTANYL CITRATE 0.05 MG/ML IJ SOLN: 25.0000 ug | INTRAMUSCULAR | Status: DC | PRN

## 2014-01-09 MED ORDER — DIAZEPAM 5 MG PO TABS: 10.0000 mg | ORAL_TABLET | ORAL | Status: DC

## 2014-01-09 MED ORDER — FENTANYL CITRATE 0.05 MG/ML IJ SOLN
INTRAMUSCULAR | Status: AC
  Filled 2014-01-09: qty 2

## 2014-01-09 MED ORDER — DEXTROSE-NACL 5-0.45 % IV SOLN
INTRAVENOUS | Status: DC
  Administered 2014-01-09: 07:00:00 via INTRAVENOUS

## 2014-01-09 MED ORDER — LIDOCAINE HCL (CARDIAC) 20 MG/ML IV SOLN
INTRAVENOUS | Status: DC | PRN
  Administered 2014-01-09: 100 mg via INTRAVENOUS

## 2014-01-09 NOTE — Op Note (Signed)
See [facility] OP note scanned into chart. 

## 2014-01-09 NOTE — Discharge Instructions (Signed)
See [facility] discharge instructions in chart. Burdi restart Xralto and aspirin in 3 days

## 2014-01-09 NOTE — H&P (Signed)
ry of Present Illness Miss Gearhart presents 2 months status post her right percutaneous nephrolithotomy. She had a 3 x 3 cm stone in her right renal pelvis along with several smaller calculi. Surgery was uneventful. Postop CT did reveal an 8 mm fragment/residual stone. Clinically she continues to do well. This was her first clinical stone event. She is on anticoagulation and back on her Alen Blew.     On KUB today this stone/fragment is in the lower pole. It measures approximately 6 x 9 mm.   Past Medical History Problems  1. History of atrial fibrillation (V12.59) 2. History of diabetes mellitus (V12.29) 3. History of hypertension (V12.59)  Surgical History Problems  1. History of Appendectomy 2. History of Cataract Surgery 3. History of Cholecystectomy 4. History of Pacemaker Placement 5. History of Percutaneous Lithotomy For Stone Over 2cm. 6. History of Surgical Excision Of Ectopic Pregnancy Unspecified Location  Current Meds 1. Alendronate Sodium 70 MG Oral Tablet;  Therapy: [id number] to Recorded 2. AmLODIPine Besylate 2.5 MG Oral Tablet;  Therapy: (Recorded:03Mar2015) to Recorded 3. Calcium + D TABS;  Therapy: (Recorded:03Mar2015) to Recorded 4. Cartia XT 240 MG Oral Capsule Extended Release 24 Hour;  Therapy: (Recorded:03Mar2015) to Recorded 5. Lisinopril 20 MG Oral Tablet;  Therapy: (Recorded:03Mar2015) to Recorded 6. MetFORMIN HCl - 1000 MG Oral Tablet;  Therapy: (Recorded:03Mar2015) to Recorded 7. Metoprolol Tartrate 50 MG Oral Tablet;  Therapy: (Recorded:03Mar2015) to Recorded 8. Potassium Chloride Crys ER 20 MEQ Oral Tablet Extended Release;  Therapy: [id number] to Recorded 9. Simvastatin 5 MG Oral Tablet;  Therapy: (Recorded:03Mar2015) to Recorded 10. Sotalol HCl - 80 MG Oral Tablet;   Therapy: (Recorded:03Mar2015) to Recorded 11. Xarelto 20 MG Oral Tablet;   Therapy: (Recorded:03Mar2015) to Recorded  Allergies Medication  1. Actos TABS  Family  History Problems  1. Family history of COPD (chronic obstructive pulmonary disease) with emphysema :  Mother 2. Family history of kidney stones (V18.69) : Father  Social History Problems  1. Denied: History of Alcohol use 2. Caffeine use (V49.89)   rare 3. Divorced 4. Father deceased   82 5. Mother deceased   35 from COPD 91. Never smoker 7. Denied: History of Number of children 8. Retired  Engineer, site Vital Signs [Data Includes: Last 1 Day]  Recorded: [id number] 11:38AM  Blood Pressure: 157 / 85 Temperature: 97.6 F Heart Rate: 73  Physical Exam Constitutional: Well nourished and well developed . No acute distress.  ENT:. The ears and nose are normal in appearance.  Neck: The appearance of the neck is normal and no neck mass is present.  Pulmonary: No respiratory distress and normal respiratory rhythm and effort.  Cardiovascular: Heart rate and rhythm are normal . No peripheral edema.  Abdomen: The abdomen is soft and nontender. No masses are palpated. No CVA tenderness. No hernias are palpable. No hepatosplenomegaly noted.  Skin: Normal skin turgor, no visible rash and no visible skin lesions.  Neuro/Psych:. Mood and affect are appropriate.    Discussion/Summary 90-95 percent improvement in right renal stone burden. We talked about options of ESWL versus observation. She would like to try to completely rid herself of stone pieces. We'll set her up for ESWL sometime in the next several weeks. Success rates and risks and benefits of procedure discussed with her at length today.   Signatures Electronically signed by : Rana Snare, M.D.; [place] 12 2015  1:01PM EST

## 2014-01-09 NOTE — Anesthesia Postprocedure Evaluation (Signed)
  Anesthesia Post-op Note  Patient: Kristy Washington  Procedure(s) Performed: Procedure(s) (LRB): RIGHT EXTRACORPOREAL SHOCK WAVE LITHOTRIPSY (ESWL) (Right)  Patient Location: PACU  Anesthesia Type: MAC  Level of Consciousness: awake and alert   Airway and Oxygen Therapy: Patient Spontanous Breathing  Post-op Pain: mild  Post-op Assessment: Post-op Vital signs reviewed, Patient's Cardiovascular Status Stable, Respiratory Function Stable, Patent Airway and No signs of Nausea or vomiting  Last Vitals:  Filed Vitals:   01/09/14 0910  BP: 149/80  Pulse: 78  Temp:   Resp: 20    Post-op Vital Signs: stable   Complications: No apparent anesthesia complications

## 2014-01-09 NOTE — Transfer of Care (Signed)
Immediate Anesthesia Transfer of Care Note  Patient: Kristy Washington  Procedure(s) Performed: Procedure(s) (LRB): RIGHT EXTRACORPOREAL SHOCK WAVE LITHOTRIPSY (ESWL) (Right)  Patient Location: PACU  Anesthesia Type: MAC  Level of Consciousness: sedated, patient cooperative and responds to stimulation  Airway & Oxygen Therapy: Patient Spontanous Breathing and Patient on room air  Post-op Assessment: Report given to Short Stay RN and Post -op Vital signs reviewed and stable  Post vital signs: Reviewed and stable  Complications: No apparent anesthesia complications

## 2014-01-09 NOTE — Interval H&P Note (Signed)
History and Physical Interval Note:  01/09/2014 8:07 AM  Kristy Washington  has presented today for surgery, with the diagnosis of Right Renal Calculus  The various methods of treatment have been discussed with the patient and family. After consideration of risks, benefits and other options for treatment, the patient has consented to  Procedure(s): RIGHT EXTRACORPOREAL SHOCK WAVE LITHOTRIPSY (ESWL) (Right) as a surgical intervention .  The patient's history has been reviewed, patient examined, no change in status, stable for surgery.  I have reviewed the patient's chart and labs.  Questions were answered to the patient's satisfaction.     [name]

## 2014-01-09 NOTE — Anesthesia Preprocedure Evaluation (Signed)
Anesthesia Evaluation  Patient identified by MRN, date of birth, ID band Patient awake    Reviewed: Allergy & Precautions, H&P , NPO status , Patient's Chart, lab work & pertinent test results  Airway Mallampati: II TM Distance: >3 FB Neck ROM: Full    Dental no notable dental hx.    Pulmonary neg pulmonary ROS,  breath sounds clear to auscultation  Pulmonary exam normal       Cardiovascular hypertension, + dysrhythmias + pacemaker Rhythm:Regular Rate:Normal     Neuro/Psych negative neurological ROS  negative psych ROS   GI/Hepatic negative GI ROS, Neg liver ROS,   Endo/Other  diabetes  Renal/GU negative Renal ROS  negative genitourinary   Musculoskeletal negative musculoskeletal ROS (+)   Abdominal   Peds negative pediatric ROS (+)  Hematology negative hematology ROS (+)   Anesthesia Other Findings   Reproductive/Obstetrics negative OB ROS                           Anesthesia Physical Anesthesia Plan  ASA: III  Anesthesia Plan: MAC   Post-op Pain Management:    Induction: Intravenous  Airway Management Planned: Nasal Cannula  Additional Equipment:   Intra-op Plan:   Post-operative Plan:   Informed Consent: I have reviewed the patients History and Physical, chart, labs and discussed the procedure including the risks, benefits and alternatives for the proposed anesthesia with the patient or authorized representative who has indicated his/her understanding and acceptance.     Plan Discussed with: CRNA and Surgeon  Anesthesia Plan Comments:         Anesthesia Quick Evaluation

## 2015-01-26 IMAGING — CT CT ABDOMEN W/O CM
2 of 4 series · 16 of 46 positions shown, 18 images · non-contrast
Comparison: 08/26/2013

CLINICAL DATA: Status post kidney stones surgery

EXAM:
CT ABDOMEN WITHOUT CONTRAST
TECHNIQUE: Multidetector CT imaging of the abdomen was performed following the
standard protocol without IV contrast.

[Series 2: rtn a/p w/o · axial · non-contrast · 0.74mm/px · z∈[-362,-98]mm · 13 of 59 slices shown, 15 images]
[im 3/59  soft-tissue]
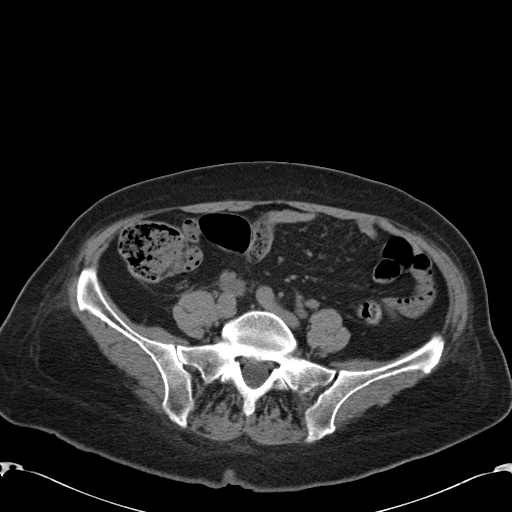
[im 3/59  bone]
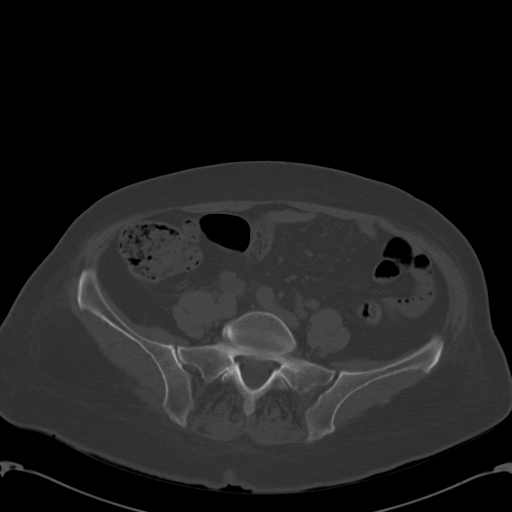
[im 8/59  soft-tissue]
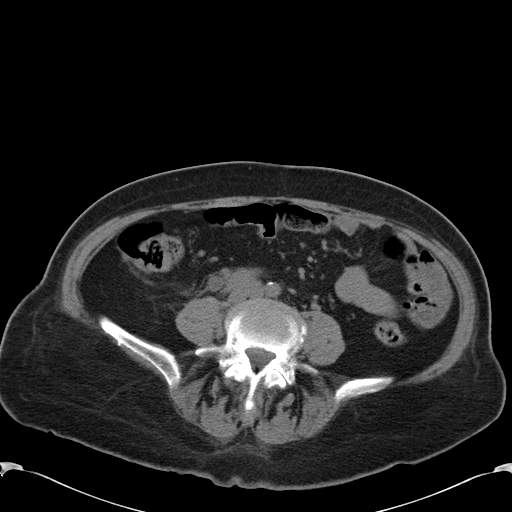
[im 13/59  soft-tissue]
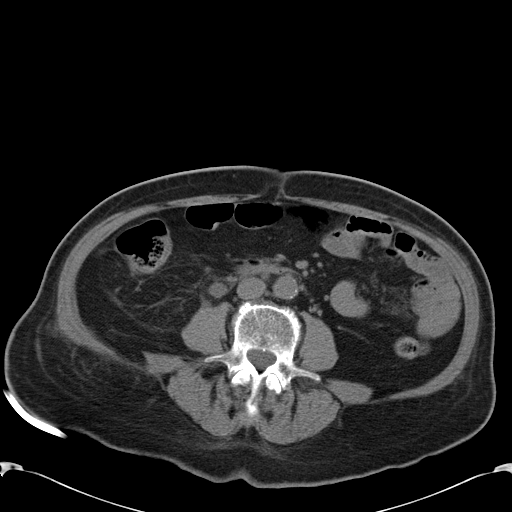
[im 16/59  soft-tissue]
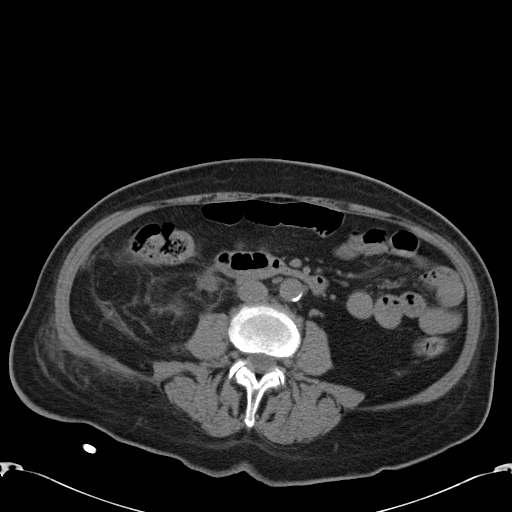
[im 21/59  soft-tissue]
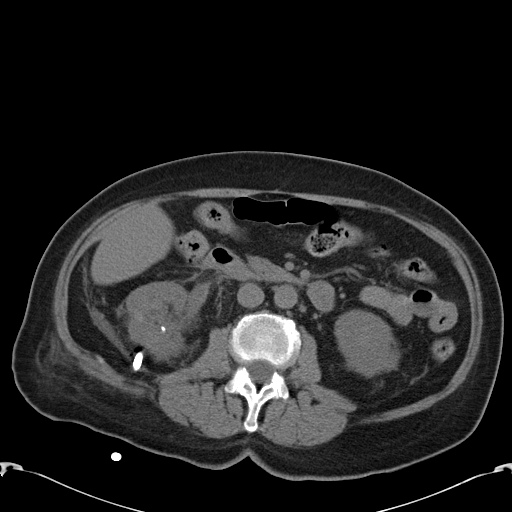
[im 26/59  soft-tissue]
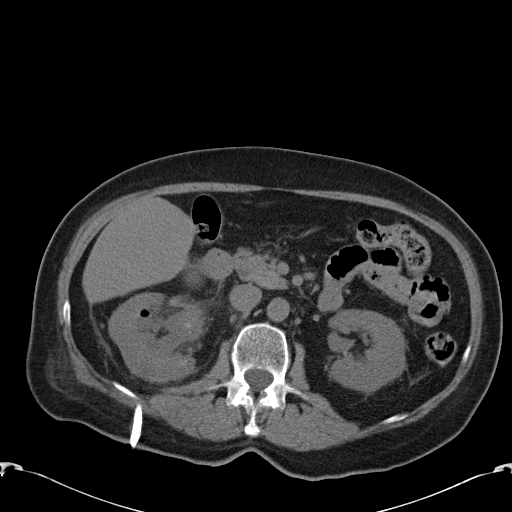
[im 31/59  soft-tissue]
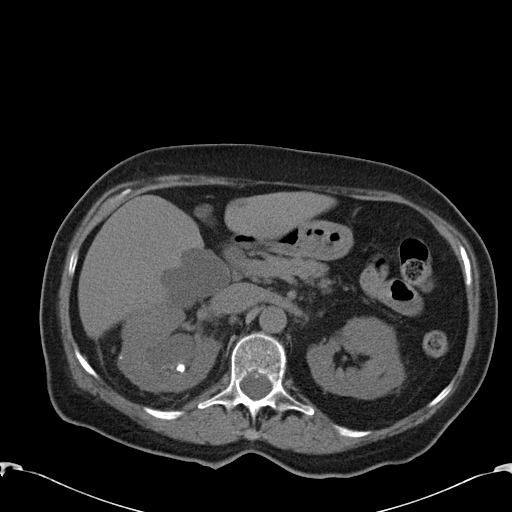
[im 33/59  soft-tissue]
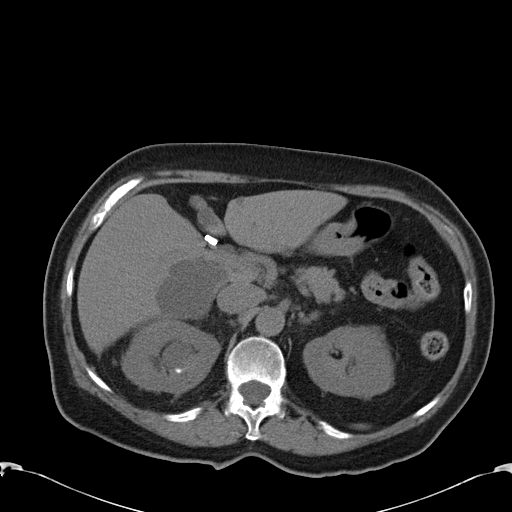
[im 38/59  soft-tissue]
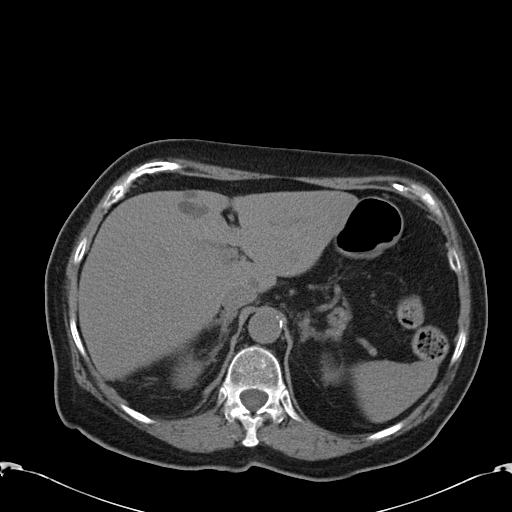
[im 38/59  bone]
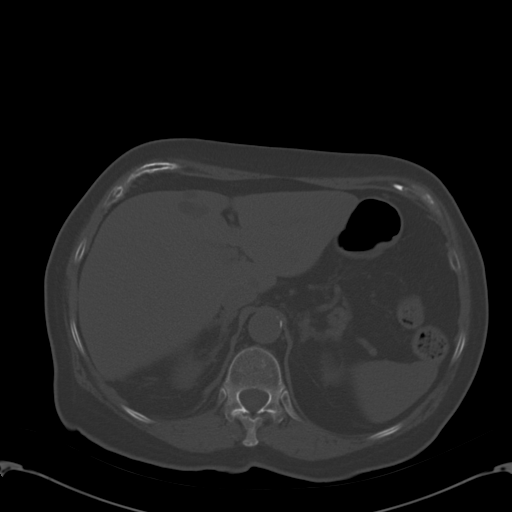
[im 43/59  soft-tissue]
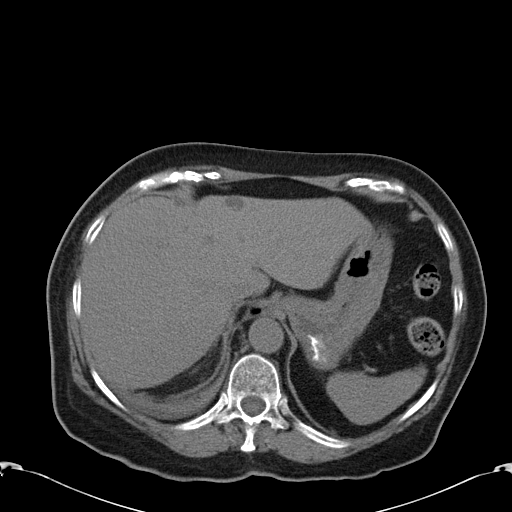
[im 46/59  soft-tissue]
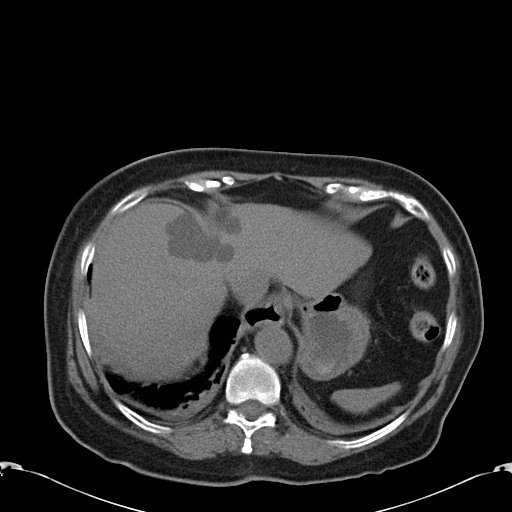
[im 51/59  soft-tissue]
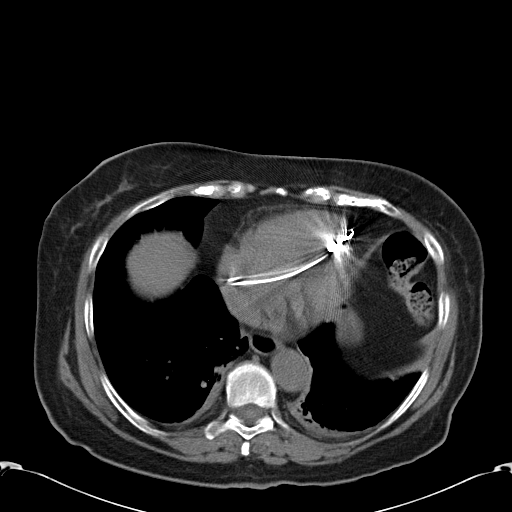
[im 56/59  soft-tissue]
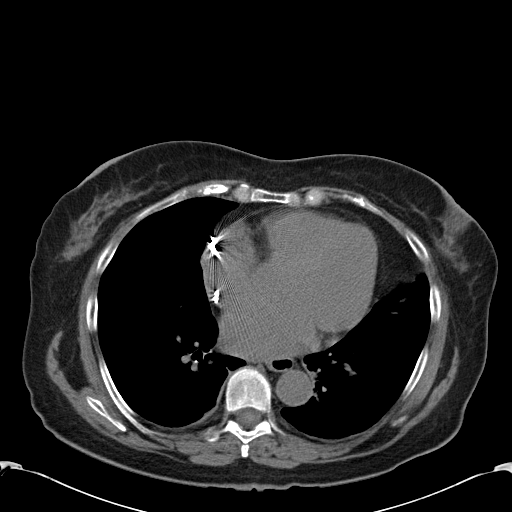

[Series 602: <mpr thick range> · coronal · 0.74mm/px · 3 of 85 slices shown]
[im 29/85  soft-tissue]
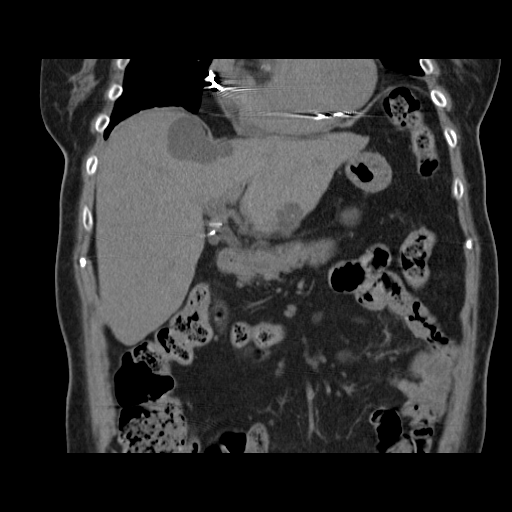
[im 38/85  soft-tissue]
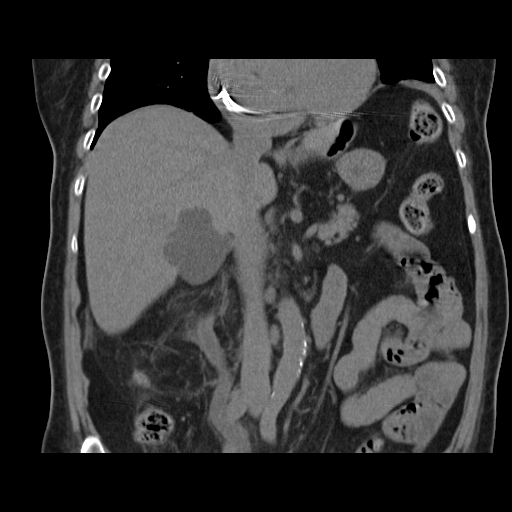
[im 47/85  soft-tissue]
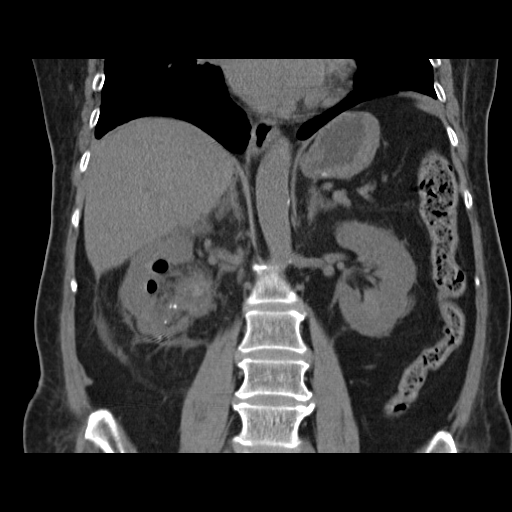

[16 of 46 positions shown; findings below may reference images not displayed]

FINDINGS: Atelectasis is noted within both lung bases posteriorly. There is no
pleural or pericardial effusion. The heart size appears moderately
enlarged. There are multiple foci of low attenuation within the
liver compatible with cysts. Previous cholecystectomy. No biliary
dilatation. Normal appearance of the pancreas. The spleen is
negative.

The adrenal glands are both normal. Cyst stress set similar
appearance of the left kidney with small cortical cyst arising from
the inferior pole. There is no left-sided nephrolithiasis or
hydronephrosis. There has been interval fragmentation and surgical
removal much of the right renal calculi. There are still stone
fragments identified within the right renal collecting system. The
largest is in the upper pole measuring 9 mm, image 28/ series 2.
There is right-sided hydronephrosis and hydroureter. Gas is
identified within the right renal collecting system which is likely
related to recent intervention. Right-sided perinephric and
periureteral fat stranding is identified. There is no significant
perinephric fluid collection identified to suggest hematoma or
urinoma. A surgical drainage catheter is in place with tip
terminating adjacent to the inferior pole of the right kidney.

Mild calcified atherosclerotic disease affects the abdominal aorta.
There is no aneurysm.

The stomach appears normal. The small bowel loops have a normal
course and caliber and there is no evidence for obstruction. Normal
appearance of the colon.

Review of the visualized bony structures is on unremarkable.
IMPRESSION: 1. Status post percutaneous nephrolithotomy. Although much of the
right renal calculi been removed there remains several stone
fragments, the largest of which is in the upper pole.
2. Right-sided hydronephrosis and hydroureter is noted.
3. No evidence for perinephric fluid collection.

## 2015-08-31 DIAGNOSIS — Z6824 Body mass index (BMI) 24.0-24.9, adult: Secondary | ICD-10-CM | POA: Diagnosis not present

## 2015-08-31 DIAGNOSIS — Z79899 Other long term (current) drug therapy: Secondary | ICD-10-CM | POA: Diagnosis not present

## 2015-08-31 DIAGNOSIS — E7800 Pure hypercholesterolemia, unspecified: Secondary | ICD-10-CM | POA: Diagnosis not present

## 2015-08-31 DIAGNOSIS — Z7189 Other specified counseling: Secondary | ICD-10-CM | POA: Diagnosis not present

## 2015-08-31 DIAGNOSIS — Z1211 Encounter for screening for malignant neoplasm of colon: Secondary | ICD-10-CM | POA: Diagnosis not present

## 2015-08-31 DIAGNOSIS — Z418 Encounter for other procedures for purposes other than remedying health state: Secondary | ICD-10-CM | POA: Diagnosis not present

## 2015-08-31 DIAGNOSIS — Z0000 Encounter for general adult medical examination without abnormal findings: Secondary | ICD-10-CM | POA: Diagnosis not present

## 2015-08-31 DIAGNOSIS — Z1389 Encounter for screening for other disorder: Secondary | ICD-10-CM | POA: Diagnosis not present

## 2015-08-31 DIAGNOSIS — R5383 Other fatigue: Secondary | ICD-10-CM | POA: Diagnosis not present

## 2015-09-11 DIAGNOSIS — Z1231 Encounter for screening mammogram for malignant neoplasm of breast: Secondary | ICD-10-CM | POA: Diagnosis not present

## 2015-09-13 DIAGNOSIS — Z85828 Personal history of other malignant neoplasm of skin: Secondary | ICD-10-CM | POA: Diagnosis not present

## 2015-09-24 DIAGNOSIS — E1165 Type 2 diabetes mellitus with hyperglycemia: Secondary | ICD-10-CM | POA: Diagnosis not present

## 2015-09-24 DIAGNOSIS — I10 Essential (primary) hypertension: Secondary | ICD-10-CM | POA: Diagnosis not present

## 2015-10-30 DIAGNOSIS — E2839 Other primary ovarian failure: Secondary | ICD-10-CM | POA: Diagnosis not present

## 2015-11-02 DIAGNOSIS — I4891 Unspecified atrial fibrillation: Secondary | ICD-10-CM | POA: Diagnosis not present

## 2015-11-02 DIAGNOSIS — E7800 Pure hypercholesterolemia, unspecified: Secondary | ICD-10-CM | POA: Diagnosis not present

## 2015-11-02 DIAGNOSIS — E119 Type 2 diabetes mellitus without complications: Secondary | ICD-10-CM | POA: Diagnosis not present

## 2015-11-02 DIAGNOSIS — I10 Essential (primary) hypertension: Secondary | ICD-10-CM | POA: Diagnosis not present

## 2015-11-26 DIAGNOSIS — I10 Essential (primary) hypertension: Secondary | ICD-10-CM | POA: Diagnosis not present

## 2015-11-26 DIAGNOSIS — E7800 Pure hypercholesterolemia, unspecified: Secondary | ICD-10-CM | POA: Diagnosis not present

## 2015-11-26 DIAGNOSIS — E119 Type 2 diabetes mellitus without complications: Secondary | ICD-10-CM | POA: Diagnosis not present

## 2015-11-26 DIAGNOSIS — I4891 Unspecified atrial fibrillation: Secondary | ICD-10-CM | POA: Diagnosis not present

## 2015-12-21 DIAGNOSIS — Z45010 Encounter for checking and testing of cardiac pacemaker pulse generator [battery]: Secondary | ICD-10-CM | POA: Diagnosis not present

## 2015-12-24 DIAGNOSIS — E1165 Type 2 diabetes mellitus with hyperglycemia: Secondary | ICD-10-CM | POA: Diagnosis not present

## 2015-12-24 DIAGNOSIS — I4891 Unspecified atrial fibrillation: Secondary | ICD-10-CM | POA: Diagnosis not present

## 2015-12-24 DIAGNOSIS — E7800 Pure hypercholesterolemia, unspecified: Secondary | ICD-10-CM | POA: Diagnosis not present

## 2016-01-14 DIAGNOSIS — I10 Essential (primary) hypertension: Secondary | ICD-10-CM | POA: Diagnosis not present

## 2016-01-14 DIAGNOSIS — E7800 Pure hypercholesterolemia, unspecified: Secondary | ICD-10-CM | POA: Diagnosis not present

## 2016-01-14 DIAGNOSIS — E119 Type 2 diabetes mellitus without complications: Secondary | ICD-10-CM | POA: Diagnosis not present

## 2016-01-14 DIAGNOSIS — I4891 Unspecified atrial fibrillation: Secondary | ICD-10-CM | POA: Diagnosis not present

## 2016-01-23 DIAGNOSIS — I480 Paroxysmal atrial fibrillation: Secondary | ICD-10-CM | POA: Diagnosis not present

## 2016-01-23 DIAGNOSIS — Z950 Presence of cardiac pacemaker: Secondary | ICD-10-CM | POA: Diagnosis not present

## 2016-01-23 DIAGNOSIS — I10 Essential (primary) hypertension: Secondary | ICD-10-CM | POA: Diagnosis not present

## 2016-01-23 DIAGNOSIS — I272 Other secondary pulmonary hypertension: Secondary | ICD-10-CM | POA: Diagnosis not present

## 2016-01-23 DIAGNOSIS — E119 Type 2 diabetes mellitus without complications: Secondary | ICD-10-CM | POA: Diagnosis not present

## 2016-02-18 DIAGNOSIS — E7800 Pure hypercholesterolemia, unspecified: Secondary | ICD-10-CM | POA: Diagnosis not present

## 2016-02-18 DIAGNOSIS — I4891 Unspecified atrial fibrillation: Secondary | ICD-10-CM | POA: Diagnosis not present

## 2016-02-18 DIAGNOSIS — E119 Type 2 diabetes mellitus without complications: Secondary | ICD-10-CM | POA: Diagnosis not present

## 2016-02-18 DIAGNOSIS — I10 Essential (primary) hypertension: Secondary | ICD-10-CM | POA: Diagnosis not present

## 2016-03-13 DIAGNOSIS — Z1283 Encounter for screening for malignant neoplasm of skin: Secondary | ICD-10-CM | POA: Diagnosis not present

## 2016-03-13 DIAGNOSIS — L820 Inflamed seborrheic keratosis: Secondary | ICD-10-CM | POA: Diagnosis not present

## 2016-03-13 DIAGNOSIS — B078 Other viral warts: Secondary | ICD-10-CM | POA: Diagnosis not present

## 2016-03-31 DIAGNOSIS — N200 Calculus of kidney: Secondary | ICD-10-CM | POA: Diagnosis not present

## 2016-03-31 DIAGNOSIS — R3915 Urgency of urination: Secondary | ICD-10-CM | POA: Diagnosis not present

## 2016-03-31 DIAGNOSIS — N905 Atrophy of vulva: Secondary | ICD-10-CM | POA: Diagnosis not present

## 2016-04-02 DIAGNOSIS — I4891 Unspecified atrial fibrillation: Secondary | ICD-10-CM | POA: Diagnosis not present

## 2016-04-02 DIAGNOSIS — I10 Essential (primary) hypertension: Secondary | ICD-10-CM | POA: Diagnosis not present

## 2016-04-02 DIAGNOSIS — E7800 Pure hypercholesterolemia, unspecified: Secondary | ICD-10-CM | POA: Diagnosis not present

## 2016-04-02 DIAGNOSIS — E119 Type 2 diabetes mellitus without complications: Secondary | ICD-10-CM | POA: Diagnosis not present

## 2016-04-07 DIAGNOSIS — Z0130 Encounter for examination of blood pressure without abnormal findings: Secondary | ICD-10-CM | POA: Diagnosis not present

## 2016-04-10 DIAGNOSIS — E1165 Type 2 diabetes mellitus with hyperglycemia: Secondary | ICD-10-CM | POA: Diagnosis not present

## 2016-04-10 DIAGNOSIS — N200 Calculus of kidney: Secondary | ICD-10-CM | POA: Diagnosis not present

## 2016-04-10 DIAGNOSIS — Z6824 Body mass index (BMI) 24.0-24.9, adult: Secondary | ICD-10-CM | POA: Diagnosis not present

## 2016-04-10 DIAGNOSIS — I4891 Unspecified atrial fibrillation: Secondary | ICD-10-CM | POA: Diagnosis not present

## 2016-04-10 DIAGNOSIS — Z789 Other specified health status: Secondary | ICD-10-CM | POA: Diagnosis not present

## 2016-04-16 DIAGNOSIS — N905 Atrophy of vulva: Secondary | ICD-10-CM | POA: Diagnosis not present

## 2016-04-16 DIAGNOSIS — Z87442 Personal history of urinary calculi: Secondary | ICD-10-CM | POA: Diagnosis not present

## 2016-04-16 DIAGNOSIS — Z79899 Other long term (current) drug therapy: Secondary | ICD-10-CM | POA: Diagnosis not present

## 2016-04-16 DIAGNOSIS — I10 Essential (primary) hypertension: Secondary | ICD-10-CM | POA: Diagnosis not present

## 2016-04-16 DIAGNOSIS — E7800 Pure hypercholesterolemia, unspecified: Secondary | ICD-10-CM | POA: Diagnosis not present

## 2016-04-16 DIAGNOSIS — Z87440 Personal history of urinary (tract) infections: Secondary | ICD-10-CM | POA: Diagnosis not present

## 2016-04-16 DIAGNOSIS — Z950 Presence of cardiac pacemaker: Secondary | ICD-10-CM | POA: Diagnosis not present

## 2016-04-16 DIAGNOSIS — E119 Type 2 diabetes mellitus without complications: Secondary | ICD-10-CM | POA: Diagnosis not present

## 2016-04-16 DIAGNOSIS — I4891 Unspecified atrial fibrillation: Secondary | ICD-10-CM | POA: Diagnosis not present

## 2016-04-16 DIAGNOSIS — Z7902 Long term (current) use of antithrombotics/antiplatelets: Secondary | ICD-10-CM | POA: Diagnosis not present

## 2016-04-16 DIAGNOSIS — Z888 Allergy status to other drugs, medicaments and biological substances status: Secondary | ICD-10-CM | POA: Diagnosis not present

## 2016-04-16 DIAGNOSIS — N200 Calculus of kidney: Secondary | ICD-10-CM | POA: Diagnosis not present

## 2016-04-16 DIAGNOSIS — M069 Rheumatoid arthritis, unspecified: Secondary | ICD-10-CM | POA: Diagnosis not present

## 2016-04-16 DIAGNOSIS — R3915 Urgency of urination: Secondary | ICD-10-CM | POA: Diagnosis not present

## 2016-04-25 DIAGNOSIS — E7800 Pure hypercholesterolemia, unspecified: Secondary | ICD-10-CM | POA: Diagnosis not present

## 2016-04-25 DIAGNOSIS — I10 Essential (primary) hypertension: Secondary | ICD-10-CM | POA: Diagnosis not present

## 2016-04-25 DIAGNOSIS — E119 Type 2 diabetes mellitus without complications: Secondary | ICD-10-CM | POA: Diagnosis not present

## 2016-04-25 DIAGNOSIS — I4891 Unspecified atrial fibrillation: Secondary | ICD-10-CM | POA: Diagnosis not present

## 2016-05-01 DIAGNOSIS — N200 Calculus of kidney: Secondary | ICD-10-CM | POA: Diagnosis not present

## 2016-05-09 DIAGNOSIS — Z23 Encounter for immunization: Secondary | ICD-10-CM | POA: Diagnosis not present

## 2016-05-15 DIAGNOSIS — H43813 Vitreous degeneration, bilateral: Secondary | ICD-10-CM | POA: Diagnosis not present

## 2016-05-15 DIAGNOSIS — Z961 Presence of intraocular lens: Secondary | ICD-10-CM | POA: Diagnosis not present

## 2016-05-15 DIAGNOSIS — E119 Type 2 diabetes mellitus without complications: Secondary | ICD-10-CM | POA: Diagnosis not present

## 2016-05-15 DIAGNOSIS — H524 Presbyopia: Secondary | ICD-10-CM | POA: Diagnosis not present

## 2016-05-19 DIAGNOSIS — L0390 Cellulitis, unspecified: Secondary | ICD-10-CM | POA: Diagnosis not present

## 2016-05-20 DIAGNOSIS — L0390 Cellulitis, unspecified: Secondary | ICD-10-CM | POA: Diagnosis not present

## 2016-05-20 DIAGNOSIS — T148XXA Other injury of unspecified body region, initial encounter: Secondary | ICD-10-CM | POA: Diagnosis not present

## 2016-05-28 DIAGNOSIS — E119 Type 2 diabetes mellitus without complications: Secondary | ICD-10-CM | POA: Diagnosis not present

## 2016-05-28 DIAGNOSIS — E7800 Pure hypercholesterolemia, unspecified: Secondary | ICD-10-CM | POA: Diagnosis not present

## 2016-05-28 DIAGNOSIS — I4891 Unspecified atrial fibrillation: Secondary | ICD-10-CM | POA: Diagnosis not present

## 2016-05-28 DIAGNOSIS — I10 Essential (primary) hypertension: Secondary | ICD-10-CM | POA: Diagnosis not present

## 2016-06-24 DIAGNOSIS — I10 Essential (primary) hypertension: Secondary | ICD-10-CM | POA: Diagnosis not present

## 2016-06-24 DIAGNOSIS — E7800 Pure hypercholesterolemia, unspecified: Secondary | ICD-10-CM | POA: Diagnosis not present

## 2016-06-24 DIAGNOSIS — E119 Type 2 diabetes mellitus without complications: Secondary | ICD-10-CM | POA: Diagnosis not present

## 2016-06-24 DIAGNOSIS — I4891 Unspecified atrial fibrillation: Secondary | ICD-10-CM | POA: Diagnosis not present

## 2016-06-27 DIAGNOSIS — Z45010 Encounter for checking and testing of cardiac pacemaker pulse generator [battery]: Secondary | ICD-10-CM | POA: Diagnosis not present

## 2016-06-27 DIAGNOSIS — Z950 Presence of cardiac pacemaker: Secondary | ICD-10-CM | POA: Diagnosis not present

## 2016-07-21 DIAGNOSIS — E119 Type 2 diabetes mellitus without complications: Secondary | ICD-10-CM | POA: Diagnosis not present

## 2016-07-21 DIAGNOSIS — I4891 Unspecified atrial fibrillation: Secondary | ICD-10-CM | POA: Diagnosis not present

## 2016-07-21 DIAGNOSIS — I10 Essential (primary) hypertension: Secondary | ICD-10-CM | POA: Diagnosis not present

## 2016-07-21 DIAGNOSIS — E7800 Pure hypercholesterolemia, unspecified: Secondary | ICD-10-CM | POA: Diagnosis not present

## 2016-07-22 DIAGNOSIS — E1165 Type 2 diabetes mellitus with hyperglycemia: Secondary | ICD-10-CM | POA: Diagnosis not present

## 2016-07-22 DIAGNOSIS — Z6824 Body mass index (BMI) 24.0-24.9, adult: Secondary | ICD-10-CM | POA: Diagnosis not present

## 2016-07-22 DIAGNOSIS — Z299 Encounter for prophylactic measures, unspecified: Secondary | ICD-10-CM | POA: Diagnosis not present

## 2016-07-22 DIAGNOSIS — Z713 Dietary counseling and surveillance: Secondary | ICD-10-CM | POA: Diagnosis not present

## 2016-07-29 DIAGNOSIS — I10 Essential (primary) hypertension: Secondary | ICD-10-CM | POA: Diagnosis not present

## 2016-07-29 DIAGNOSIS — I480 Paroxysmal atrial fibrillation: Secondary | ICD-10-CM | POA: Diagnosis not present

## 2016-07-29 DIAGNOSIS — E119 Type 2 diabetes mellitus without complications: Secondary | ICD-10-CM | POA: Diagnosis not present

## 2016-07-29 DIAGNOSIS — I340 Nonrheumatic mitral (valve) insufficiency: Secondary | ICD-10-CM | POA: Diagnosis not present

## 2016-07-29 DIAGNOSIS — I361 Nonrheumatic tricuspid (valve) insufficiency: Secondary | ICD-10-CM | POA: Diagnosis not present

## 2016-07-29 DIAGNOSIS — I272 Pulmonary hypertension, unspecified: Secondary | ICD-10-CM | POA: Diagnosis not present

## 2016-09-09 DIAGNOSIS — R5383 Other fatigue: Secondary | ICD-10-CM | POA: Diagnosis not present

## 2016-09-09 DIAGNOSIS — Z79899 Other long term (current) drug therapy: Secondary | ICD-10-CM | POA: Diagnosis not present

## 2016-09-09 DIAGNOSIS — Z1389 Encounter for screening for other disorder: Secondary | ICD-10-CM | POA: Diagnosis not present

## 2016-09-09 DIAGNOSIS — E1165 Type 2 diabetes mellitus with hyperglycemia: Secondary | ICD-10-CM | POA: Diagnosis not present

## 2016-09-09 DIAGNOSIS — Z6824 Body mass index (BMI) 24.0-24.9, adult: Secondary | ICD-10-CM | POA: Diagnosis not present

## 2016-09-09 DIAGNOSIS — E7800 Pure hypercholesterolemia, unspecified: Secondary | ICD-10-CM | POA: Diagnosis not present

## 2016-09-09 DIAGNOSIS — I4891 Unspecified atrial fibrillation: Secondary | ICD-10-CM | POA: Diagnosis not present

## 2016-09-09 DIAGNOSIS — Z0000 Encounter for general adult medical examination without abnormal findings: Secondary | ICD-10-CM | POA: Diagnosis not present

## 2016-09-09 DIAGNOSIS — Z299 Encounter for prophylactic measures, unspecified: Secondary | ICD-10-CM | POA: Diagnosis not present

## 2016-09-09 DIAGNOSIS — I10 Essential (primary) hypertension: Secondary | ICD-10-CM | POA: Diagnosis not present

## 2016-09-09 DIAGNOSIS — Z1211 Encounter for screening for malignant neoplasm of colon: Secondary | ICD-10-CM | POA: Diagnosis not present

## 2016-09-09 DIAGNOSIS — Z7189 Other specified counseling: Secondary | ICD-10-CM | POA: Diagnosis not present

## 2016-09-09 DIAGNOSIS — N898 Other specified noninflammatory disorders of vagina: Secondary | ICD-10-CM | POA: Diagnosis not present

## 2016-09-16 DIAGNOSIS — L820 Inflamed seborrheic keratosis: Secondary | ICD-10-CM | POA: Diagnosis not present

## 2016-09-16 DIAGNOSIS — Z85828 Personal history of other malignant neoplasm of skin: Secondary | ICD-10-CM | POA: Diagnosis not present

## 2016-09-16 DIAGNOSIS — Z1231 Encounter for screening mammogram for malignant neoplasm of breast: Secondary | ICD-10-CM | POA: Diagnosis not present

## 2016-09-18 DIAGNOSIS — E7800 Pure hypercholesterolemia, unspecified: Secondary | ICD-10-CM | POA: Diagnosis not present

## 2016-09-18 DIAGNOSIS — I10 Essential (primary) hypertension: Secondary | ICD-10-CM | POA: Diagnosis not present

## 2016-09-18 DIAGNOSIS — I4891 Unspecified atrial fibrillation: Secondary | ICD-10-CM | POA: Diagnosis not present

## 2016-09-18 DIAGNOSIS — E119 Type 2 diabetes mellitus without complications: Secondary | ICD-10-CM | POA: Diagnosis not present

## 2016-10-24 DIAGNOSIS — Z45010 Encounter for checking and testing of cardiac pacemaker pulse generator [battery]: Secondary | ICD-10-CM | POA: Diagnosis not present

## 2016-10-28 DIAGNOSIS — Z299 Encounter for prophylactic measures, unspecified: Secondary | ICD-10-CM | POA: Diagnosis not present

## 2016-10-28 DIAGNOSIS — E1165 Type 2 diabetes mellitus with hyperglycemia: Secondary | ICD-10-CM | POA: Diagnosis not present

## 2016-10-28 DIAGNOSIS — Z6825 Body mass index (BMI) 25.0-25.9, adult: Secondary | ICD-10-CM | POA: Diagnosis not present

## 2016-10-28 DIAGNOSIS — Z713 Dietary counseling and surveillance: Secondary | ICD-10-CM | POA: Diagnosis not present

## 2016-10-29 DIAGNOSIS — N200 Calculus of kidney: Secondary | ICD-10-CM | POA: Diagnosis not present

## 2016-10-29 DIAGNOSIS — N393 Stress incontinence (female) (male): Secondary | ICD-10-CM | POA: Diagnosis not present

## 2016-12-04 DIAGNOSIS — E7800 Pure hypercholesterolemia, unspecified: Secondary | ICD-10-CM | POA: Diagnosis not present

## 2016-12-04 DIAGNOSIS — I10 Essential (primary) hypertension: Secondary | ICD-10-CM | POA: Diagnosis not present

## 2016-12-04 DIAGNOSIS — I4891 Unspecified atrial fibrillation: Secondary | ICD-10-CM | POA: Diagnosis not present

## 2016-12-04 DIAGNOSIS — E119 Type 2 diabetes mellitus without complications: Secondary | ICD-10-CM | POA: Diagnosis not present

## 2016-12-21 DIAGNOSIS — Z8679 Personal history of other diseases of the circulatory system: Secondary | ICD-10-CM | POA: Diagnosis not present

## 2016-12-21 DIAGNOSIS — S93691A Other sprain of right foot, initial encounter: Secondary | ICD-10-CM | POA: Diagnosis not present

## 2016-12-21 DIAGNOSIS — M79671 Pain in right foot: Secondary | ICD-10-CM | POA: Diagnosis not present

## 2016-12-21 DIAGNOSIS — E8881 Metabolic syndrome: Secondary | ICD-10-CM | POA: Diagnosis not present

## 2017-01-16 DIAGNOSIS — E119 Type 2 diabetes mellitus without complications: Secondary | ICD-10-CM | POA: Diagnosis not present

## 2017-01-16 DIAGNOSIS — E7800 Pure hypercholesterolemia, unspecified: Secondary | ICD-10-CM | POA: Diagnosis not present

## 2017-01-16 DIAGNOSIS — I10 Essential (primary) hypertension: Secondary | ICD-10-CM | POA: Diagnosis not present

## 2017-01-16 DIAGNOSIS — I4891 Unspecified atrial fibrillation: Secondary | ICD-10-CM | POA: Diagnosis not present

## 2017-01-27 DIAGNOSIS — R079 Chest pain, unspecified: Secondary | ICD-10-CM | POA: Diagnosis not present

## 2017-01-27 DIAGNOSIS — E119 Type 2 diabetes mellitus without complications: Secondary | ICD-10-CM | POA: Diagnosis not present

## 2017-01-27 DIAGNOSIS — I272 Pulmonary hypertension, unspecified: Secondary | ICD-10-CM | POA: Diagnosis not present

## 2017-01-27 DIAGNOSIS — I517 Cardiomegaly: Secondary | ICD-10-CM | POA: Diagnosis not present

## 2017-01-27 DIAGNOSIS — I361 Nonrheumatic tricuspid (valve) insufficiency: Secondary | ICD-10-CM | POA: Diagnosis not present

## 2017-01-27 DIAGNOSIS — I10 Essential (primary) hypertension: Secondary | ICD-10-CM | POA: Diagnosis not present

## 2017-01-27 DIAGNOSIS — R0602 Shortness of breath: Secondary | ICD-10-CM | POA: Diagnosis not present

## 2017-01-27 DIAGNOSIS — I480 Paroxysmal atrial fibrillation: Secondary | ICD-10-CM | POA: Diagnosis not present

## 2017-01-27 DIAGNOSIS — I340 Nonrheumatic mitral (valve) insufficiency: Secondary | ICD-10-CM | POA: Diagnosis not present

## 2017-01-29 DIAGNOSIS — R0602 Shortness of breath: Secondary | ICD-10-CM | POA: Diagnosis not present

## 2017-01-29 DIAGNOSIS — R072 Precordial pain: Secondary | ICD-10-CM | POA: Diagnosis not present

## 2017-01-30 DIAGNOSIS — Z45010 Encounter for checking and testing of cardiac pacemaker pulse generator [battery]: Secondary | ICD-10-CM | POA: Diagnosis not present

## 2017-02-03 DIAGNOSIS — R072 Precordial pain: Secondary | ICD-10-CM | POA: Diagnosis not present

## 2017-02-03 DIAGNOSIS — R0602 Shortness of breath: Secondary | ICD-10-CM | POA: Diagnosis not present

## 2017-02-05 DIAGNOSIS — I2510 Atherosclerotic heart disease of native coronary artery without angina pectoris: Secondary | ICD-10-CM | POA: Diagnosis not present

## 2017-02-05 DIAGNOSIS — I340 Nonrheumatic mitral (valve) insufficiency: Secondary | ICD-10-CM | POA: Diagnosis not present

## 2017-02-05 DIAGNOSIS — I361 Nonrheumatic tricuspid (valve) insufficiency: Secondary | ICD-10-CM | POA: Diagnosis not present

## 2017-02-05 DIAGNOSIS — R9439 Abnormal result of other cardiovascular function study: Secondary | ICD-10-CM | POA: Diagnosis not present

## 2017-02-05 DIAGNOSIS — I119 Hypertensive heart disease without heart failure: Secondary | ICD-10-CM | POA: Diagnosis not present

## 2017-02-05 DIAGNOSIS — I272 Pulmonary hypertension, unspecified: Secondary | ICD-10-CM | POA: Diagnosis not present

## 2017-02-05 DIAGNOSIS — E119 Type 2 diabetes mellitus without complications: Secondary | ICD-10-CM | POA: Diagnosis not present

## 2017-02-05 DIAGNOSIS — I2584 Coronary atherosclerosis due to calcified coronary lesion: Secondary | ICD-10-CM | POA: Diagnosis not present

## 2017-02-05 DIAGNOSIS — I480 Paroxysmal atrial fibrillation: Secondary | ICD-10-CM | POA: Diagnosis not present

## 2017-02-05 DIAGNOSIS — I495 Sick sinus syndrome: Secondary | ICD-10-CM | POA: Diagnosis not present

## 2017-02-16 DIAGNOSIS — E119 Type 2 diabetes mellitus without complications: Secondary | ICD-10-CM | POA: Diagnosis not present

## 2017-02-16 DIAGNOSIS — I4891 Unspecified atrial fibrillation: Secondary | ICD-10-CM | POA: Diagnosis not present

## 2017-02-16 DIAGNOSIS — E7800 Pure hypercholesterolemia, unspecified: Secondary | ICD-10-CM | POA: Diagnosis not present

## 2017-02-16 DIAGNOSIS — I10 Essential (primary) hypertension: Secondary | ICD-10-CM | POA: Diagnosis not present

## 2017-02-24 DIAGNOSIS — Z950 Presence of cardiac pacemaker: Secondary | ICD-10-CM | POA: Diagnosis not present

## 2017-02-24 DIAGNOSIS — I10 Essential (primary) hypertension: Secondary | ICD-10-CM | POA: Diagnosis not present

## 2017-02-24 DIAGNOSIS — E1165 Type 2 diabetes mellitus with hyperglycemia: Secondary | ICD-10-CM | POA: Diagnosis not present

## 2017-02-24 DIAGNOSIS — I4891 Unspecified atrial fibrillation: Secondary | ICD-10-CM | POA: Diagnosis not present

## 2017-02-24 DIAGNOSIS — Z299 Encounter for prophylactic measures, unspecified: Secondary | ICD-10-CM | POA: Diagnosis not present

## 2017-02-24 DIAGNOSIS — E7800 Pure hypercholesterolemia, unspecified: Secondary | ICD-10-CM | POA: Diagnosis not present

## 2017-02-27 DIAGNOSIS — Z45010 Encounter for checking and testing of cardiac pacemaker pulse generator [battery]: Secondary | ICD-10-CM | POA: Diagnosis not present

## 2017-02-27 DIAGNOSIS — I495 Sick sinus syndrome: Secondary | ICD-10-CM | POA: Diagnosis not present

## 2017-03-04 DIAGNOSIS — I119 Hypertensive heart disease without heart failure: Secondary | ICD-10-CM | POA: Diagnosis not present

## 2017-03-04 DIAGNOSIS — Z79899 Other long term (current) drug therapy: Secondary | ICD-10-CM | POA: Diagnosis not present

## 2017-03-04 DIAGNOSIS — Z45010 Encounter for checking and testing of cardiac pacemaker pulse generator [battery]: Secondary | ICD-10-CM | POA: Diagnosis not present

## 2017-03-04 DIAGNOSIS — E119 Type 2 diabetes mellitus without complications: Secondary | ICD-10-CM | POA: Diagnosis not present

## 2017-03-04 DIAGNOSIS — Z7984 Long term (current) use of oral hypoglycemic drugs: Secondary | ICD-10-CM | POA: Diagnosis not present

## 2017-03-04 DIAGNOSIS — T82191A Other mechanical complication of cardiac pulse generator (battery), initial encounter: Secondary | ICD-10-CM | POA: Diagnosis not present

## 2017-03-04 DIAGNOSIS — Z7902 Long term (current) use of antithrombotics/antiplatelets: Secondary | ICD-10-CM | POA: Diagnosis not present

## 2017-03-04 DIAGNOSIS — I495 Sick sinus syndrome: Secondary | ICD-10-CM | POA: Diagnosis not present

## 2017-03-04 DIAGNOSIS — I4891 Unspecified atrial fibrillation: Secondary | ICD-10-CM | POA: Diagnosis not present

## 2017-03-04 DIAGNOSIS — I340 Nonrheumatic mitral (valve) insufficiency: Secondary | ICD-10-CM | POA: Diagnosis not present

## 2017-03-04 DIAGNOSIS — I272 Pulmonary hypertension, unspecified: Secondary | ICD-10-CM | POA: Diagnosis not present

## 2017-03-04 DIAGNOSIS — I361 Nonrheumatic tricuspid (valve) insufficiency: Secondary | ICD-10-CM | POA: Diagnosis not present

## 2017-03-23 DIAGNOSIS — I10 Essential (primary) hypertension: Secondary | ICD-10-CM | POA: Diagnosis not present

## 2017-03-23 DIAGNOSIS — E7800 Pure hypercholesterolemia, unspecified: Secondary | ICD-10-CM | POA: Diagnosis not present

## 2017-03-23 DIAGNOSIS — Z85828 Personal history of other malignant neoplasm of skin: Secondary | ICD-10-CM | POA: Diagnosis not present

## 2017-03-23 DIAGNOSIS — E119 Type 2 diabetes mellitus without complications: Secondary | ICD-10-CM | POA: Diagnosis not present

## 2017-03-23 DIAGNOSIS — I4891 Unspecified atrial fibrillation: Secondary | ICD-10-CM | POA: Diagnosis not present

## 2017-03-23 DIAGNOSIS — Z1283 Encounter for screening for malignant neoplasm of skin: Secondary | ICD-10-CM | POA: Diagnosis not present

## 2017-03-23 DIAGNOSIS — L570 Actinic keratosis: Secondary | ICD-10-CM | POA: Diagnosis not present

## 2017-03-25 DIAGNOSIS — R0602 Shortness of breath: Secondary | ICD-10-CM | POA: Diagnosis not present

## 2017-04-01 DIAGNOSIS — R0602 Shortness of breath: Secondary | ICD-10-CM | POA: Diagnosis not present

## 2017-04-22 DIAGNOSIS — I4891 Unspecified atrial fibrillation: Secondary | ICD-10-CM | POA: Diagnosis not present

## 2017-04-22 DIAGNOSIS — E7800 Pure hypercholesterolemia, unspecified: Secondary | ICD-10-CM | POA: Diagnosis not present

## 2017-04-22 DIAGNOSIS — I10 Essential (primary) hypertension: Secondary | ICD-10-CM | POA: Diagnosis not present

## 2017-04-22 DIAGNOSIS — E119 Type 2 diabetes mellitus without complications: Secondary | ICD-10-CM | POA: Diagnosis not present

## 2017-05-11 DIAGNOSIS — I10 Essential (primary) hypertension: Secondary | ICD-10-CM | POA: Diagnosis not present

## 2017-05-11 DIAGNOSIS — E119 Type 2 diabetes mellitus without complications: Secondary | ICD-10-CM | POA: Diagnosis not present

## 2017-05-11 DIAGNOSIS — I4891 Unspecified atrial fibrillation: Secondary | ICD-10-CM | POA: Diagnosis not present

## 2017-05-11 DIAGNOSIS — E7800 Pure hypercholesterolemia, unspecified: Secondary | ICD-10-CM | POA: Diagnosis not present

## 2017-05-12 DIAGNOSIS — Z23 Encounter for immunization: Secondary | ICD-10-CM | POA: Diagnosis not present

## 2017-05-18 DIAGNOSIS — H524 Presbyopia: Secondary | ICD-10-CM | POA: Diagnosis not present

## 2017-05-18 DIAGNOSIS — H02402 Unspecified ptosis of left eyelid: Secondary | ICD-10-CM | POA: Diagnosis not present

## 2017-05-18 DIAGNOSIS — E119 Type 2 diabetes mellitus without complications: Secondary | ICD-10-CM | POA: Diagnosis not present

## 2017-05-18 DIAGNOSIS — H532 Diplopia: Secondary | ICD-10-CM | POA: Diagnosis not present

## 2017-06-05 DIAGNOSIS — Z45010 Encounter for checking and testing of cardiac pacemaker pulse generator [battery]: Secondary | ICD-10-CM | POA: Diagnosis not present

## 2017-06-09 DIAGNOSIS — E1165 Type 2 diabetes mellitus with hyperglycemia: Secondary | ICD-10-CM | POA: Diagnosis not present

## 2017-06-09 DIAGNOSIS — I4891 Unspecified atrial fibrillation: Secondary | ICD-10-CM | POA: Diagnosis not present

## 2017-06-09 DIAGNOSIS — Z299 Encounter for prophylactic measures, unspecified: Secondary | ICD-10-CM | POA: Diagnosis not present

## 2017-06-09 DIAGNOSIS — E663 Overweight: Secondary | ICD-10-CM | POA: Diagnosis not present

## 2017-06-09 DIAGNOSIS — Z713 Dietary counseling and surveillance: Secondary | ICD-10-CM | POA: Diagnosis not present

## 2017-06-11 DIAGNOSIS — E7800 Pure hypercholesterolemia, unspecified: Secondary | ICD-10-CM | POA: Diagnosis not present

## 2017-06-11 DIAGNOSIS — I4891 Unspecified atrial fibrillation: Secondary | ICD-10-CM | POA: Diagnosis not present

## 2017-06-11 DIAGNOSIS — I10 Essential (primary) hypertension: Secondary | ICD-10-CM | POA: Diagnosis not present

## 2017-06-11 DIAGNOSIS — E119 Type 2 diabetes mellitus without complications: Secondary | ICD-10-CM | POA: Diagnosis not present

## 2017-06-30 DIAGNOSIS — I480 Paroxysmal atrial fibrillation: Secondary | ICD-10-CM | POA: Diagnosis not present

## 2017-06-30 DIAGNOSIS — Z950 Presence of cardiac pacemaker: Secondary | ICD-10-CM | POA: Diagnosis not present

## 2017-06-30 DIAGNOSIS — I10 Essential (primary) hypertension: Secondary | ICD-10-CM | POA: Diagnosis not present

## 2017-06-30 DIAGNOSIS — E119 Type 2 diabetes mellitus without complications: Secondary | ICD-10-CM | POA: Diagnosis not present

## 2017-06-30 DIAGNOSIS — Z7901 Long term (current) use of anticoagulants: Secondary | ICD-10-CM | POA: Diagnosis not present

## 2017-07-30 DIAGNOSIS — J0140 Acute pansinusitis, unspecified: Secondary | ICD-10-CM | POA: Diagnosis not present

## 2017-07-30 DIAGNOSIS — R0981 Nasal congestion: Secondary | ICD-10-CM | POA: Diagnosis not present

## 2017-07-30 DIAGNOSIS — R05 Cough: Secondary | ICD-10-CM | POA: Diagnosis not present

## 2017-07-30 DIAGNOSIS — J40 Bronchitis, not specified as acute or chronic: Secondary | ICD-10-CM | POA: Diagnosis not present

## 2017-09-03 DIAGNOSIS — I4891 Unspecified atrial fibrillation: Secondary | ICD-10-CM | POA: Diagnosis not present

## 2017-09-03 DIAGNOSIS — I10 Essential (primary) hypertension: Secondary | ICD-10-CM | POA: Diagnosis not present

## 2017-09-03 DIAGNOSIS — E119 Type 2 diabetes mellitus without complications: Secondary | ICD-10-CM | POA: Diagnosis not present

## 2017-09-03 DIAGNOSIS — E7800 Pure hypercholesterolemia, unspecified: Secondary | ICD-10-CM | POA: Diagnosis not present

## 2017-09-15 DIAGNOSIS — Z1331 Encounter for screening for depression: Secondary | ICD-10-CM | POA: Diagnosis not present

## 2017-09-15 DIAGNOSIS — Z1211 Encounter for screening for malignant neoplasm of colon: Secondary | ICD-10-CM | POA: Diagnosis not present

## 2017-09-15 DIAGNOSIS — I4891 Unspecified atrial fibrillation: Secondary | ICD-10-CM | POA: Diagnosis not present

## 2017-09-15 DIAGNOSIS — Z1339 Encounter for screening examination for other mental health and behavioral disorders: Secondary | ICD-10-CM | POA: Diagnosis not present

## 2017-09-15 DIAGNOSIS — Z79899 Other long term (current) drug therapy: Secondary | ICD-10-CM | POA: Diagnosis not present

## 2017-09-15 DIAGNOSIS — Z299 Encounter for prophylactic measures, unspecified: Secondary | ICD-10-CM | POA: Diagnosis not present

## 2017-09-15 DIAGNOSIS — E663 Overweight: Secondary | ICD-10-CM | POA: Diagnosis not present

## 2017-09-15 DIAGNOSIS — Z0000 Encounter for general adult medical examination without abnormal findings: Secondary | ICD-10-CM | POA: Diagnosis not present

## 2017-09-15 DIAGNOSIS — E1165 Type 2 diabetes mellitus with hyperglycemia: Secondary | ICD-10-CM | POA: Diagnosis not present

## 2017-09-15 DIAGNOSIS — R5383 Other fatigue: Secondary | ICD-10-CM | POA: Diagnosis not present

## 2017-09-15 DIAGNOSIS — I10 Essential (primary) hypertension: Secondary | ICD-10-CM | POA: Diagnosis not present

## 2017-09-15 DIAGNOSIS — Z7189 Other specified counseling: Secondary | ICD-10-CM | POA: Diagnosis not present

## 2017-09-21 DIAGNOSIS — Z1231 Encounter for screening mammogram for malignant neoplasm of breast: Secondary | ICD-10-CM | POA: Diagnosis not present

## 2017-09-21 DIAGNOSIS — R921 Mammographic calcification found on diagnostic imaging of breast: Secondary | ICD-10-CM | POA: Diagnosis not present

## 2017-09-23 DIAGNOSIS — Z85828 Personal history of other malignant neoplasm of skin: Secondary | ICD-10-CM | POA: Diagnosis not present

## 2017-09-24 DIAGNOSIS — Z79899 Other long term (current) drug therapy: Secondary | ICD-10-CM | POA: Diagnosis not present

## 2017-10-05 DIAGNOSIS — E7800 Pure hypercholesterolemia, unspecified: Secondary | ICD-10-CM | POA: Diagnosis not present

## 2017-10-05 DIAGNOSIS — I10 Essential (primary) hypertension: Secondary | ICD-10-CM | POA: Diagnosis not present

## 2017-10-05 DIAGNOSIS — I4891 Unspecified atrial fibrillation: Secondary | ICD-10-CM | POA: Diagnosis not present

## 2017-10-05 DIAGNOSIS — E119 Type 2 diabetes mellitus without complications: Secondary | ICD-10-CM | POA: Diagnosis not present

## 2017-10-16 DIAGNOSIS — E119 Type 2 diabetes mellitus without complications: Secondary | ICD-10-CM | POA: Diagnosis not present

## 2017-10-16 DIAGNOSIS — E7800 Pure hypercholesterolemia, unspecified: Secondary | ICD-10-CM | POA: Diagnosis not present

## 2017-10-16 DIAGNOSIS — I10 Essential (primary) hypertension: Secondary | ICD-10-CM | POA: Diagnosis not present

## 2017-10-16 DIAGNOSIS — I4891 Unspecified atrial fibrillation: Secondary | ICD-10-CM | POA: Diagnosis not present

## 2017-11-18 DIAGNOSIS — E7800 Pure hypercholesterolemia, unspecified: Secondary | ICD-10-CM | POA: Diagnosis not present

## 2017-11-18 DIAGNOSIS — E119 Type 2 diabetes mellitus without complications: Secondary | ICD-10-CM | POA: Diagnosis not present

## 2017-11-18 DIAGNOSIS — I10 Essential (primary) hypertension: Secondary | ICD-10-CM | POA: Diagnosis not present

## 2017-11-18 DIAGNOSIS — I4891 Unspecified atrial fibrillation: Secondary | ICD-10-CM | POA: Diagnosis not present

## 2017-12-04 DIAGNOSIS — Z45010 Encounter for checking and testing of cardiac pacemaker pulse generator [battery]: Secondary | ICD-10-CM | POA: Diagnosis not present

## 2017-12-17 DIAGNOSIS — Z6825 Body mass index (BMI) 25.0-25.9, adult: Secondary | ICD-10-CM | POA: Diagnosis not present

## 2017-12-17 DIAGNOSIS — I4891 Unspecified atrial fibrillation: Secondary | ICD-10-CM | POA: Diagnosis not present

## 2017-12-17 DIAGNOSIS — Z299 Encounter for prophylactic measures, unspecified: Secondary | ICD-10-CM | POA: Diagnosis not present

## 2017-12-17 DIAGNOSIS — Z713 Dietary counseling and surveillance: Secondary | ICD-10-CM | POA: Diagnosis not present

## 2017-12-17 DIAGNOSIS — E1165 Type 2 diabetes mellitus with hyperglycemia: Secondary | ICD-10-CM | POA: Diagnosis not present

## 2017-12-29 DIAGNOSIS — Z950 Presence of cardiac pacemaker: Secondary | ICD-10-CM | POA: Diagnosis not present

## 2017-12-29 DIAGNOSIS — I119 Hypertensive heart disease without heart failure: Secondary | ICD-10-CM | POA: Diagnosis not present

## 2017-12-29 DIAGNOSIS — Z7984 Long term (current) use of oral hypoglycemic drugs: Secondary | ICD-10-CM | POA: Diagnosis not present

## 2017-12-29 DIAGNOSIS — Z7901 Long term (current) use of anticoagulants: Secondary | ICD-10-CM | POA: Diagnosis not present

## 2017-12-29 DIAGNOSIS — I480 Paroxysmal atrial fibrillation: Secondary | ICD-10-CM | POA: Diagnosis not present

## 2017-12-29 DIAGNOSIS — E119 Type 2 diabetes mellitus without complications: Secondary | ICD-10-CM | POA: Diagnosis not present

## 2018-01-07 DIAGNOSIS — I4891 Unspecified atrial fibrillation: Secondary | ICD-10-CM | POA: Diagnosis not present

## 2018-01-07 DIAGNOSIS — E119 Type 2 diabetes mellitus without complications: Secondary | ICD-10-CM | POA: Diagnosis not present

## 2018-01-07 DIAGNOSIS — I10 Essential (primary) hypertension: Secondary | ICD-10-CM | POA: Diagnosis not present

## 2018-01-07 DIAGNOSIS — E7800 Pure hypercholesterolemia, unspecified: Secondary | ICD-10-CM | POA: Diagnosis not present

## 2018-02-16 DIAGNOSIS — I4891 Unspecified atrial fibrillation: Secondary | ICD-10-CM | POA: Diagnosis not present

## 2018-02-16 DIAGNOSIS — I10 Essential (primary) hypertension: Secondary | ICD-10-CM | POA: Diagnosis not present

## 2018-02-16 DIAGNOSIS — E7800 Pure hypercholesterolemia, unspecified: Secondary | ICD-10-CM | POA: Diagnosis not present

## 2018-02-16 DIAGNOSIS — E119 Type 2 diabetes mellitus without complications: Secondary | ICD-10-CM | POA: Diagnosis not present

## 2018-03-25 DIAGNOSIS — I10 Essential (primary) hypertension: Secondary | ICD-10-CM | POA: Diagnosis not present

## 2018-03-25 DIAGNOSIS — I4891 Unspecified atrial fibrillation: Secondary | ICD-10-CM | POA: Diagnosis not present

## 2018-03-25 DIAGNOSIS — Z6824 Body mass index (BMI) 24.0-24.9, adult: Secondary | ICD-10-CM | POA: Diagnosis not present

## 2018-03-25 DIAGNOSIS — Z299 Encounter for prophylactic measures, unspecified: Secondary | ICD-10-CM | POA: Diagnosis not present

## 2018-03-25 DIAGNOSIS — E1165 Type 2 diabetes mellitus with hyperglycemia: Secondary | ICD-10-CM | POA: Diagnosis not present

## 2018-04-27 DIAGNOSIS — Z872 Personal history of diseases of the skin and subcutaneous tissue: Secondary | ICD-10-CM | POA: Diagnosis not present

## 2018-04-27 DIAGNOSIS — L820 Inflamed seborrheic keratosis: Secondary | ICD-10-CM | POA: Diagnosis not present

## 2018-04-27 DIAGNOSIS — D1801 Hemangioma of skin and subcutaneous tissue: Secondary | ICD-10-CM | POA: Diagnosis not present

## 2018-04-27 DIAGNOSIS — L80 Vitiligo: Secondary | ICD-10-CM | POA: Diagnosis not present

## 2018-04-27 DIAGNOSIS — Z85828 Personal history of other malignant neoplasm of skin: Secondary | ICD-10-CM | POA: Diagnosis not present

## 2018-04-27 DIAGNOSIS — L814 Other melanin hyperpigmentation: Secondary | ICD-10-CM | POA: Diagnosis not present

## 2018-04-27 DIAGNOSIS — D229 Melanocytic nevi, unspecified: Secondary | ICD-10-CM | POA: Diagnosis not present

## 2018-05-03 DIAGNOSIS — Z23 Encounter for immunization: Secondary | ICD-10-CM | POA: Diagnosis not present

## 2018-05-04 DIAGNOSIS — E119 Type 2 diabetes mellitus without complications: Secondary | ICD-10-CM | POA: Diagnosis not present

## 2018-05-04 DIAGNOSIS — I10 Essential (primary) hypertension: Secondary | ICD-10-CM | POA: Diagnosis not present

## 2018-05-04 DIAGNOSIS — E7800 Pure hypercholesterolemia, unspecified: Secondary | ICD-10-CM | POA: Diagnosis not present

## 2018-05-04 DIAGNOSIS — I4891 Unspecified atrial fibrillation: Secondary | ICD-10-CM | POA: Diagnosis not present

## 2018-05-17 DIAGNOSIS — Z45010 Encounter for checking and testing of cardiac pacemaker pulse generator [battery]: Secondary | ICD-10-CM | POA: Diagnosis not present

## 2018-06-04 DIAGNOSIS — E119 Type 2 diabetes mellitus without complications: Secondary | ICD-10-CM | POA: Diagnosis not present

## 2018-06-04 DIAGNOSIS — E7800 Pure hypercholesterolemia, unspecified: Secondary | ICD-10-CM | POA: Diagnosis not present

## 2018-06-04 DIAGNOSIS — I4891 Unspecified atrial fibrillation: Secondary | ICD-10-CM | POA: Diagnosis not present

## 2018-06-04 DIAGNOSIS — I10 Essential (primary) hypertension: Secondary | ICD-10-CM | POA: Diagnosis not present

## 2018-06-07 DIAGNOSIS — H52203 Unspecified astigmatism, bilateral: Secondary | ICD-10-CM | POA: Diagnosis not present

## 2018-06-07 DIAGNOSIS — H0100A Unspecified blepharitis right eye, upper and lower eyelids: Secondary | ICD-10-CM | POA: Diagnosis not present

## 2018-06-07 DIAGNOSIS — H532 Diplopia: Secondary | ICD-10-CM | POA: Diagnosis not present

## 2018-06-07 DIAGNOSIS — E119 Type 2 diabetes mellitus without complications: Secondary | ICD-10-CM | POA: Diagnosis not present

## 2018-06-29 DIAGNOSIS — Z950 Presence of cardiac pacemaker: Secondary | ICD-10-CM | POA: Diagnosis not present

## 2018-06-29 DIAGNOSIS — Z7901 Long term (current) use of anticoagulants: Secondary | ICD-10-CM | POA: Diagnosis not present

## 2018-06-29 DIAGNOSIS — I361 Nonrheumatic tricuspid (valve) insufficiency: Secondary | ICD-10-CM | POA: Diagnosis not present

## 2018-06-29 DIAGNOSIS — E119 Type 2 diabetes mellitus without complications: Secondary | ICD-10-CM | POA: Diagnosis not present

## 2018-06-29 DIAGNOSIS — I119 Hypertensive heart disease without heart failure: Secondary | ICD-10-CM | POA: Diagnosis not present

## 2018-06-29 DIAGNOSIS — I480 Paroxysmal atrial fibrillation: Secondary | ICD-10-CM | POA: Diagnosis not present

## 2018-07-01 DIAGNOSIS — Z299 Encounter for prophylactic measures, unspecified: Secondary | ICD-10-CM | POA: Diagnosis not present

## 2018-07-01 DIAGNOSIS — E1165 Type 2 diabetes mellitus with hyperglycemia: Secondary | ICD-10-CM | POA: Diagnosis not present

## 2018-07-01 DIAGNOSIS — I4891 Unspecified atrial fibrillation: Secondary | ICD-10-CM | POA: Diagnosis not present

## 2018-07-01 DIAGNOSIS — Z6824 Body mass index (BMI) 24.0-24.9, adult: Secondary | ICD-10-CM | POA: Diagnosis not present

## 2018-07-01 DIAGNOSIS — I10 Essential (primary) hypertension: Secondary | ICD-10-CM | POA: Diagnosis not present

## 2018-07-05 DIAGNOSIS — E119 Type 2 diabetes mellitus without complications: Secondary | ICD-10-CM | POA: Diagnosis not present

## 2018-07-05 DIAGNOSIS — I10 Essential (primary) hypertension: Secondary | ICD-10-CM | POA: Diagnosis not present

## 2018-07-05 DIAGNOSIS — E7800 Pure hypercholesterolemia, unspecified: Secondary | ICD-10-CM | POA: Diagnosis not present

## 2018-07-05 DIAGNOSIS — I4891 Unspecified atrial fibrillation: Secondary | ICD-10-CM | POA: Diagnosis not present

## 2018-07-14 DIAGNOSIS — Z6824 Body mass index (BMI) 24.0-24.9, adult: Secondary | ICD-10-CM | POA: Diagnosis not present

## 2018-07-14 DIAGNOSIS — J329 Chronic sinusitis, unspecified: Secondary | ICD-10-CM | POA: Diagnosis not present

## 2018-07-14 DIAGNOSIS — I10 Essential (primary) hypertension: Secondary | ICD-10-CM | POA: Diagnosis not present

## 2018-07-14 DIAGNOSIS — Z299 Encounter for prophylactic measures, unspecified: Secondary | ICD-10-CM | POA: Diagnosis not present

## 2018-09-03 DIAGNOSIS — E119 Type 2 diabetes mellitus without complications: Secondary | ICD-10-CM | POA: Diagnosis not present

## 2018-09-03 DIAGNOSIS — I10 Essential (primary) hypertension: Secondary | ICD-10-CM | POA: Diagnosis not present

## 2018-09-03 DIAGNOSIS — E7800 Pure hypercholesterolemia, unspecified: Secondary | ICD-10-CM | POA: Diagnosis not present

## 2018-09-03 DIAGNOSIS — I4891 Unspecified atrial fibrillation: Secondary | ICD-10-CM | POA: Diagnosis not present

## 2018-09-20 DIAGNOSIS — Z7189 Other specified counseling: Secondary | ICD-10-CM | POA: Diagnosis not present

## 2018-09-20 DIAGNOSIS — I10 Essential (primary) hypertension: Secondary | ICD-10-CM | POA: Diagnosis not present

## 2018-09-20 DIAGNOSIS — Z299 Encounter for prophylactic measures, unspecified: Secondary | ICD-10-CM | POA: Diagnosis not present

## 2018-09-20 DIAGNOSIS — E7800 Pure hypercholesterolemia, unspecified: Secondary | ICD-10-CM | POA: Diagnosis not present

## 2018-09-20 DIAGNOSIS — Z1211 Encounter for screening for malignant neoplasm of colon: Secondary | ICD-10-CM | POA: Diagnosis not present

## 2018-09-20 DIAGNOSIS — Z1331 Encounter for screening for depression: Secondary | ICD-10-CM | POA: Diagnosis not present

## 2018-09-20 DIAGNOSIS — Z0000 Encounter for general adult medical examination without abnormal findings: Secondary | ICD-10-CM | POA: Diagnosis not present

## 2018-09-20 DIAGNOSIS — I4891 Unspecified atrial fibrillation: Secondary | ICD-10-CM | POA: Diagnosis not present

## 2018-09-20 DIAGNOSIS — Z6824 Body mass index (BMI) 24.0-24.9, adult: Secondary | ICD-10-CM | POA: Diagnosis not present

## 2018-09-20 DIAGNOSIS — Z1339 Encounter for screening examination for other mental health and behavioral disorders: Secondary | ICD-10-CM | POA: Diagnosis not present

## 2018-09-20 DIAGNOSIS — R5383 Other fatigue: Secondary | ICD-10-CM | POA: Diagnosis not present

## 2018-09-21 DIAGNOSIS — Z79899 Other long term (current) drug therapy: Secondary | ICD-10-CM | POA: Diagnosis not present

## 2018-09-21 DIAGNOSIS — R5383 Other fatigue: Secondary | ICD-10-CM | POA: Diagnosis not present

## 2018-09-21 DIAGNOSIS — E7800 Pure hypercholesterolemia, unspecified: Secondary | ICD-10-CM | POA: Diagnosis not present

## 2018-10-05 DIAGNOSIS — E2839 Other primary ovarian failure: Secondary | ICD-10-CM | POA: Diagnosis not present

## 2018-10-11 DIAGNOSIS — Z6823 Body mass index (BMI) 23.0-23.9, adult: Secondary | ICD-10-CM | POA: Diagnosis not present

## 2018-10-11 DIAGNOSIS — I4891 Unspecified atrial fibrillation: Secondary | ICD-10-CM | POA: Diagnosis not present

## 2018-10-11 DIAGNOSIS — I10 Essential (primary) hypertension: Secondary | ICD-10-CM | POA: Diagnosis not present

## 2018-10-11 DIAGNOSIS — Z299 Encounter for prophylactic measures, unspecified: Secondary | ICD-10-CM | POA: Diagnosis not present

## 2018-10-11 DIAGNOSIS — E1165 Type 2 diabetes mellitus with hyperglycemia: Secondary | ICD-10-CM | POA: Diagnosis not present

## 2018-11-04 DIAGNOSIS — I4891 Unspecified atrial fibrillation: Secondary | ICD-10-CM | POA: Diagnosis not present

## 2018-11-04 DIAGNOSIS — I10 Essential (primary) hypertension: Secondary | ICD-10-CM | POA: Diagnosis not present

## 2018-11-04 DIAGNOSIS — E7800 Pure hypercholesterolemia, unspecified: Secondary | ICD-10-CM | POA: Diagnosis not present

## 2018-11-04 DIAGNOSIS — E119 Type 2 diabetes mellitus without complications: Secondary | ICD-10-CM | POA: Diagnosis not present

## 2018-11-29 DIAGNOSIS — E7800 Pure hypercholesterolemia, unspecified: Secondary | ICD-10-CM | POA: Diagnosis not present

## 2018-11-29 DIAGNOSIS — E119 Type 2 diabetes mellitus without complications: Secondary | ICD-10-CM | POA: Diagnosis not present

## 2018-11-29 DIAGNOSIS — I10 Essential (primary) hypertension: Secondary | ICD-10-CM | POA: Diagnosis not present

## 2018-11-29 DIAGNOSIS — I4891 Unspecified atrial fibrillation: Secondary | ICD-10-CM | POA: Diagnosis not present

## 2018-12-22 DIAGNOSIS — E7800 Pure hypercholesterolemia, unspecified: Secondary | ICD-10-CM | POA: Diagnosis not present

## 2018-12-22 DIAGNOSIS — I4891 Unspecified atrial fibrillation: Secondary | ICD-10-CM | POA: Diagnosis not present

## 2018-12-22 DIAGNOSIS — I10 Essential (primary) hypertension: Secondary | ICD-10-CM | POA: Diagnosis not present

## 2018-12-22 DIAGNOSIS — E119 Type 2 diabetes mellitus without complications: Secondary | ICD-10-CM | POA: Diagnosis not present

## 2018-12-28 DIAGNOSIS — E1165 Type 2 diabetes mellitus with hyperglycemia: Secondary | ICD-10-CM | POA: Diagnosis not present

## 2018-12-28 DIAGNOSIS — I340 Nonrheumatic mitral (valve) insufficiency: Secondary | ICD-10-CM | POA: Diagnosis not present

## 2018-12-28 DIAGNOSIS — I119 Hypertensive heart disease without heart failure: Secondary | ICD-10-CM | POA: Diagnosis not present

## 2018-12-28 DIAGNOSIS — Z950 Presence of cardiac pacemaker: Secondary | ICD-10-CM | POA: Diagnosis not present

## 2018-12-28 DIAGNOSIS — I361 Nonrheumatic tricuspid (valve) insufficiency: Secondary | ICD-10-CM | POA: Diagnosis not present

## 2018-12-28 DIAGNOSIS — I495 Sick sinus syndrome: Secondary | ICD-10-CM | POA: Diagnosis not present

## 2018-12-28 DIAGNOSIS — I480 Paroxysmal atrial fibrillation: Secondary | ICD-10-CM | POA: Diagnosis not present

## 2019-01-17 DIAGNOSIS — I10 Essential (primary) hypertension: Secondary | ICD-10-CM | POA: Diagnosis not present

## 2019-01-17 DIAGNOSIS — Z299 Encounter for prophylactic measures, unspecified: Secondary | ICD-10-CM | POA: Diagnosis not present

## 2019-01-17 DIAGNOSIS — Z713 Dietary counseling and surveillance: Secondary | ICD-10-CM | POA: Diagnosis not present

## 2019-01-17 DIAGNOSIS — Z6824 Body mass index (BMI) 24.0-24.9, adult: Secondary | ICD-10-CM | POA: Diagnosis not present

## 2019-01-17 DIAGNOSIS — E1165 Type 2 diabetes mellitus with hyperglycemia: Secondary | ICD-10-CM | POA: Diagnosis not present

## 2019-01-25 DIAGNOSIS — I4891 Unspecified atrial fibrillation: Secondary | ICD-10-CM | POA: Diagnosis not present

## 2019-01-25 DIAGNOSIS — E7800 Pure hypercholesterolemia, unspecified: Secondary | ICD-10-CM | POA: Diagnosis not present

## 2019-01-25 DIAGNOSIS — E119 Type 2 diabetes mellitus without complications: Secondary | ICD-10-CM | POA: Diagnosis not present

## 2019-02-03 DIAGNOSIS — I10 Essential (primary) hypertension: Secondary | ICD-10-CM | POA: Diagnosis not present

## 2019-03-02 DIAGNOSIS — I10 Essential (primary) hypertension: Secondary | ICD-10-CM | POA: Diagnosis not present

## 2019-03-14 DIAGNOSIS — Z950 Presence of cardiac pacemaker: Secondary | ICD-10-CM | POA: Diagnosis not present

## 2019-04-05 DIAGNOSIS — Z23 Encounter for immunization: Secondary | ICD-10-CM | POA: Diagnosis not present

## 2019-04-06 DIAGNOSIS — I10 Essential (primary) hypertension: Secondary | ICD-10-CM | POA: Diagnosis not present

## 2019-04-25 DIAGNOSIS — I10 Essential (primary) hypertension: Secondary | ICD-10-CM | POA: Diagnosis not present

## 2019-04-25 DIAGNOSIS — I4891 Unspecified atrial fibrillation: Secondary | ICD-10-CM | POA: Diagnosis not present

## 2019-04-25 DIAGNOSIS — E1165 Type 2 diabetes mellitus with hyperglycemia: Secondary | ICD-10-CM | POA: Diagnosis not present

## 2019-04-25 DIAGNOSIS — Z6823 Body mass index (BMI) 23.0-23.9, adult: Secondary | ICD-10-CM | POA: Diagnosis not present

## 2019-04-25 DIAGNOSIS — Z299 Encounter for prophylactic measures, unspecified: Secondary | ICD-10-CM | POA: Diagnosis not present

## 2019-05-02 DIAGNOSIS — I10 Essential (primary) hypertension: Secondary | ICD-10-CM | POA: Diagnosis not present

## 2019-05-09 DIAGNOSIS — Z950 Presence of cardiac pacemaker: Secondary | ICD-10-CM | POA: Diagnosis not present

## 2019-05-09 DIAGNOSIS — E119 Type 2 diabetes mellitus without complications: Secondary | ICD-10-CM | POA: Diagnosis not present

## 2019-05-09 DIAGNOSIS — I4891 Unspecified atrial fibrillation: Secondary | ICD-10-CM | POA: Diagnosis not present

## 2019-05-09 DIAGNOSIS — E785 Hyperlipidemia, unspecified: Secondary | ICD-10-CM | POA: Diagnosis not present

## 2019-05-09 DIAGNOSIS — I10 Essential (primary) hypertension: Secondary | ICD-10-CM | POA: Diagnosis not present

## 2019-06-14 DIAGNOSIS — Z85828 Personal history of other malignant neoplasm of skin: Secondary | ICD-10-CM | POA: Diagnosis not present

## 2019-06-14 DIAGNOSIS — L821 Other seborrheic keratosis: Secondary | ICD-10-CM | POA: Diagnosis not present

## 2019-06-14 DIAGNOSIS — L80 Vitiligo: Secondary | ICD-10-CM | POA: Diagnosis not present

## 2019-06-14 DIAGNOSIS — D1801 Hemangioma of skin and subcutaneous tissue: Secondary | ICD-10-CM | POA: Diagnosis not present

## 2019-06-14 DIAGNOSIS — L989 Disorder of the skin and subcutaneous tissue, unspecified: Secondary | ICD-10-CM | POA: Diagnosis not present

## 2019-06-14 DIAGNOSIS — D485 Neoplasm of uncertain behavior of skin: Secondary | ICD-10-CM | POA: Diagnosis not present

## 2019-07-25 DIAGNOSIS — H524 Presbyopia: Secondary | ICD-10-CM | POA: Diagnosis not present

## 2019-07-25 DIAGNOSIS — H43813 Vitreous degeneration, bilateral: Secondary | ICD-10-CM | POA: Diagnosis not present

## 2019-07-25 DIAGNOSIS — H04123 Dry eye syndrome of bilateral lacrimal glands: Secondary | ICD-10-CM | POA: Diagnosis not present

## 2019-07-25 DIAGNOSIS — E119 Type 2 diabetes mellitus without complications: Secondary | ICD-10-CM | POA: Diagnosis not present

## 2019-08-04 DIAGNOSIS — I4891 Unspecified atrial fibrillation: Secondary | ICD-10-CM | POA: Diagnosis not present

## 2019-08-04 DIAGNOSIS — E119 Type 2 diabetes mellitus without complications: Secondary | ICD-10-CM | POA: Diagnosis not present

## 2019-08-04 DIAGNOSIS — E7800 Pure hypercholesterolemia, unspecified: Secondary | ICD-10-CM | POA: Diagnosis not present

## 2019-09-02 DIAGNOSIS — E119 Type 2 diabetes mellitus without complications: Secondary | ICD-10-CM | POA: Diagnosis not present

## 2019-09-02 DIAGNOSIS — I4891 Unspecified atrial fibrillation: Secondary | ICD-10-CM | POA: Diagnosis not present

## 2019-09-02 DIAGNOSIS — E7800 Pure hypercholesterolemia, unspecified: Secondary | ICD-10-CM | POA: Diagnosis not present

## 2019-09-26 DIAGNOSIS — Z299 Encounter for prophylactic measures, unspecified: Secondary | ICD-10-CM | POA: Diagnosis not present

## 2019-09-26 DIAGNOSIS — Z6823 Body mass index (BMI) 23.0-23.9, adult: Secondary | ICD-10-CM | POA: Diagnosis not present

## 2019-09-26 DIAGNOSIS — Z1211 Encounter for screening for malignant neoplasm of colon: Secondary | ICD-10-CM | POA: Diagnosis not present

## 2019-09-26 DIAGNOSIS — Z0000 Encounter for general adult medical examination without abnormal findings: Secondary | ICD-10-CM | POA: Diagnosis not present

## 2019-09-26 DIAGNOSIS — Z79899 Other long term (current) drug therapy: Secondary | ICD-10-CM | POA: Diagnosis not present

## 2019-09-26 DIAGNOSIS — R5383 Other fatigue: Secondary | ICD-10-CM | POA: Diagnosis not present

## 2019-09-26 DIAGNOSIS — Z7189 Other specified counseling: Secondary | ICD-10-CM | POA: Diagnosis not present

## 2019-09-26 DIAGNOSIS — G4700 Insomnia, unspecified: Secondary | ICD-10-CM | POA: Diagnosis not present

## 2019-09-26 DIAGNOSIS — Z1331 Encounter for screening for depression: Secondary | ICD-10-CM | POA: Diagnosis not present

## 2019-09-26 DIAGNOSIS — E7800 Pure hypercholesterolemia, unspecified: Secondary | ICD-10-CM | POA: Diagnosis not present

## 2019-09-26 DIAGNOSIS — I10 Essential (primary) hypertension: Secondary | ICD-10-CM | POA: Diagnosis not present

## 2019-09-26 DIAGNOSIS — Z1339 Encounter for screening examination for other mental health and behavioral disorders: Secondary | ICD-10-CM | POA: Diagnosis not present

## 2019-10-06 DIAGNOSIS — I4891 Unspecified atrial fibrillation: Secondary | ICD-10-CM | POA: Diagnosis not present

## 2019-10-06 DIAGNOSIS — E119 Type 2 diabetes mellitus without complications: Secondary | ICD-10-CM | POA: Diagnosis not present

## 2019-10-06 DIAGNOSIS — E7800 Pure hypercholesterolemia, unspecified: Secondary | ICD-10-CM | POA: Diagnosis not present

## 2019-10-08 DIAGNOSIS — Z23 Encounter for immunization: Secondary | ICD-10-CM | POA: Diagnosis not present

## 2019-11-03 DIAGNOSIS — E1165 Type 2 diabetes mellitus with hyperglycemia: Secondary | ICD-10-CM | POA: Diagnosis not present

## 2019-11-03 DIAGNOSIS — Z299 Encounter for prophylactic measures, unspecified: Secondary | ICD-10-CM | POA: Diagnosis not present

## 2019-11-03 DIAGNOSIS — I10 Essential (primary) hypertension: Secondary | ICD-10-CM | POA: Diagnosis not present

## 2019-11-05 DIAGNOSIS — Z23 Encounter for immunization: Secondary | ICD-10-CM | POA: Diagnosis not present

## 2019-11-07 DIAGNOSIS — I4891 Unspecified atrial fibrillation: Secondary | ICD-10-CM | POA: Diagnosis not present

## 2019-11-07 DIAGNOSIS — E7800 Pure hypercholesterolemia, unspecified: Secondary | ICD-10-CM | POA: Diagnosis not present

## 2019-11-07 DIAGNOSIS — E119 Type 2 diabetes mellitus without complications: Secondary | ICD-10-CM | POA: Diagnosis not present

## 2019-11-16 DIAGNOSIS — I10 Essential (primary) hypertension: Secondary | ICD-10-CM | POA: Diagnosis not present

## 2019-11-16 DIAGNOSIS — E785 Hyperlipidemia, unspecified: Secondary | ICD-10-CM | POA: Diagnosis not present

## 2019-11-16 DIAGNOSIS — I4891 Unspecified atrial fibrillation: Secondary | ICD-10-CM | POA: Diagnosis not present

## 2019-11-16 DIAGNOSIS — Z45010 Encounter for checking and testing of cardiac pacemaker pulse generator [battery]: Secondary | ICD-10-CM | POA: Diagnosis not present

## 2019-11-16 DIAGNOSIS — R0600 Dyspnea, unspecified: Secondary | ICD-10-CM | POA: Diagnosis not present

## 2019-11-16 DIAGNOSIS — Z950 Presence of cardiac pacemaker: Secondary | ICD-10-CM | POA: Diagnosis not present

## 2019-11-30 DIAGNOSIS — Z950 Presence of cardiac pacemaker: Secondary | ICD-10-CM | POA: Diagnosis not present

## 2019-11-30 DIAGNOSIS — I10 Essential (primary) hypertension: Secondary | ICD-10-CM | POA: Diagnosis not present

## 2019-11-30 DIAGNOSIS — I4891 Unspecified atrial fibrillation: Secondary | ICD-10-CM | POA: Diagnosis not present

## 2019-11-30 DIAGNOSIS — R0609 Other forms of dyspnea: Secondary | ICD-10-CM | POA: Diagnosis not present

## 2019-12-05 DIAGNOSIS — Z299 Encounter for prophylactic measures, unspecified: Secondary | ICD-10-CM | POA: Diagnosis not present

## 2019-12-05 DIAGNOSIS — R197 Diarrhea, unspecified: Secondary | ICD-10-CM | POA: Diagnosis not present

## 2019-12-05 DIAGNOSIS — I4891 Unspecified atrial fibrillation: Secondary | ICD-10-CM | POA: Diagnosis not present

## 2019-12-05 DIAGNOSIS — Z950 Presence of cardiac pacemaker: Secondary | ICD-10-CM | POA: Diagnosis not present

## 2019-12-05 DIAGNOSIS — Z789 Other specified health status: Secondary | ICD-10-CM | POA: Diagnosis not present

## 2020-01-08 DIAGNOSIS — I4891 Unspecified atrial fibrillation: Secondary | ICD-10-CM | POA: Diagnosis not present

## 2020-01-08 DIAGNOSIS — E119 Type 2 diabetes mellitus without complications: Secondary | ICD-10-CM | POA: Diagnosis not present

## 2020-01-08 DIAGNOSIS — E7800 Pure hypercholesterolemia, unspecified: Secondary | ICD-10-CM | POA: Diagnosis not present

## 2020-01-13 DIAGNOSIS — K589 Irritable bowel syndrome without diarrhea: Secondary | ICD-10-CM | POA: Diagnosis not present

## 2020-01-13 DIAGNOSIS — E1165 Type 2 diabetes mellitus with hyperglycemia: Secondary | ICD-10-CM | POA: Diagnosis not present

## 2020-01-13 DIAGNOSIS — Z299 Encounter for prophylactic measures, unspecified: Secondary | ICD-10-CM | POA: Diagnosis not present

## 2020-01-13 DIAGNOSIS — I10 Essential (primary) hypertension: Secondary | ICD-10-CM | POA: Diagnosis not present

## 2020-01-25 DIAGNOSIS — I10 Essential (primary) hypertension: Secondary | ICD-10-CM | POA: Diagnosis not present

## 2020-01-25 DIAGNOSIS — K589 Irritable bowel syndrome without diarrhea: Secondary | ICD-10-CM | POA: Diagnosis not present

## 2020-01-25 DIAGNOSIS — Z299 Encounter for prophylactic measures, unspecified: Secondary | ICD-10-CM | POA: Diagnosis not present

## 2020-01-25 DIAGNOSIS — Z789 Other specified health status: Secondary | ICD-10-CM | POA: Diagnosis not present

## 2020-01-25 DIAGNOSIS — K580 Irritable bowel syndrome with diarrhea: Secondary | ICD-10-CM | POA: Diagnosis not present

## 2020-01-25 DIAGNOSIS — Z713 Dietary counseling and surveillance: Secondary | ICD-10-CM | POA: Diagnosis not present

## 2020-02-08 DIAGNOSIS — E7800 Pure hypercholesterolemia, unspecified: Secondary | ICD-10-CM | POA: Diagnosis not present

## 2020-02-08 DIAGNOSIS — E119 Type 2 diabetes mellitus without complications: Secondary | ICD-10-CM | POA: Diagnosis not present

## 2020-02-08 DIAGNOSIS — I4891 Unspecified atrial fibrillation: Secondary | ICD-10-CM | POA: Diagnosis not present

## 2020-02-18 DIAGNOSIS — N281 Cyst of kidney, acquired: Secondary | ICD-10-CM | POA: Diagnosis not present

## 2020-02-18 DIAGNOSIS — I700 Atherosclerosis of aorta: Secondary | ICD-10-CM | POA: Diagnosis not present

## 2020-02-18 DIAGNOSIS — Z7984 Long term (current) use of oral hypoglycemic drugs: Secondary | ICD-10-CM | POA: Diagnosis not present

## 2020-02-18 DIAGNOSIS — I10 Essential (primary) hypertension: Secondary | ICD-10-CM | POA: Diagnosis present

## 2020-02-18 DIAGNOSIS — Z20822 Contact with and (suspected) exposure to covid-19: Secondary | ICD-10-CM | POA: Diagnosis present

## 2020-02-18 DIAGNOSIS — E876 Hypokalemia: Secondary | ICD-10-CM | POA: Diagnosis present

## 2020-02-18 DIAGNOSIS — N390 Urinary tract infection, site not specified: Secondary | ICD-10-CM | POA: Diagnosis not present

## 2020-02-18 DIAGNOSIS — Z9049 Acquired absence of other specified parts of digestive tract: Secondary | ICD-10-CM | POA: Diagnosis not present

## 2020-02-18 DIAGNOSIS — K7689 Other specified diseases of liver: Secondary | ICD-10-CM | POA: Diagnosis not present

## 2020-02-18 DIAGNOSIS — N2889 Other specified disorders of kidney and ureter: Secondary | ICD-10-CM | POA: Diagnosis not present

## 2020-02-18 DIAGNOSIS — E785 Hyperlipidemia, unspecified: Secondary | ICD-10-CM | POA: Diagnosis not present

## 2020-02-18 DIAGNOSIS — E119 Type 2 diabetes mellitus without complications: Secondary | ICD-10-CM | POA: Diagnosis not present

## 2020-02-18 DIAGNOSIS — I4891 Unspecified atrial fibrillation: Secondary | ICD-10-CM | POA: Diagnosis not present

## 2020-02-18 DIAGNOSIS — K580 Irritable bowel syndrome with diarrhea: Secondary | ICD-10-CM | POA: Diagnosis present

## 2020-02-18 DIAGNOSIS — R1013 Epigastric pain: Secondary | ICD-10-CM | POA: Diagnosis not present

## 2020-02-18 DIAGNOSIS — K858 Other acute pancreatitis without necrosis or infection: Secondary | ICD-10-CM | POA: Diagnosis not present

## 2020-02-18 DIAGNOSIS — M810 Age-related osteoporosis without current pathological fracture: Secondary | ICD-10-CM | POA: Diagnosis present

## 2020-02-18 DIAGNOSIS — Z7982 Long term (current) use of aspirin: Secondary | ICD-10-CM | POA: Diagnosis not present

## 2020-02-18 DIAGNOSIS — K859 Acute pancreatitis without necrosis or infection, unspecified: Secondary | ICD-10-CM | POA: Diagnosis not present

## 2020-02-22 ENCOUNTER — Encounter: Payer: Self-pay | Admitting: Physician Assistant

## 2020-02-27 DIAGNOSIS — I700 Atherosclerosis of aorta: Secondary | ICD-10-CM | POA: Diagnosis not present

## 2020-02-27 DIAGNOSIS — Z299 Encounter for prophylactic measures, unspecified: Secondary | ICD-10-CM | POA: Diagnosis not present

## 2020-02-27 DIAGNOSIS — I10 Essential (primary) hypertension: Secondary | ICD-10-CM | POA: Diagnosis not present

## 2020-02-27 DIAGNOSIS — K859 Acute pancreatitis without necrosis or infection, unspecified: Secondary | ICD-10-CM | POA: Diagnosis not present

## 2020-02-27 DIAGNOSIS — R197 Diarrhea, unspecified: Secondary | ICD-10-CM | POA: Diagnosis not present

## 2020-02-27 DIAGNOSIS — B370 Candidal stomatitis: Secondary | ICD-10-CM | POA: Diagnosis not present

## 2020-03-09 DIAGNOSIS — E119 Type 2 diabetes mellitus without complications: Secondary | ICD-10-CM | POA: Diagnosis not present

## 2020-03-09 DIAGNOSIS — E7800 Pure hypercholesterolemia, unspecified: Secondary | ICD-10-CM | POA: Diagnosis not present

## 2020-03-09 DIAGNOSIS — I4891 Unspecified atrial fibrillation: Secondary | ICD-10-CM | POA: Diagnosis not present

## 2020-03-12 DIAGNOSIS — I4891 Unspecified atrial fibrillation: Secondary | ICD-10-CM | POA: Diagnosis not present

## 2020-03-12 DIAGNOSIS — E119 Type 2 diabetes mellitus without complications: Secondary | ICD-10-CM | POA: Diagnosis not present

## 2020-03-12 DIAGNOSIS — E7800 Pure hypercholesterolemia, unspecified: Secondary | ICD-10-CM | POA: Diagnosis not present

## 2020-03-14 DIAGNOSIS — I10 Essential (primary) hypertension: Secondary | ICD-10-CM | POA: Diagnosis not present

## 2020-03-14 DIAGNOSIS — F419 Anxiety disorder, unspecified: Secondary | ICD-10-CM | POA: Diagnosis not present

## 2020-03-14 DIAGNOSIS — E1165 Type 2 diabetes mellitus with hyperglycemia: Secondary | ICD-10-CM | POA: Diagnosis not present

## 2020-03-14 DIAGNOSIS — Z299 Encounter for prophylactic measures, unspecified: Secondary | ICD-10-CM | POA: Diagnosis not present

## 2020-03-14 DIAGNOSIS — K589 Irritable bowel syndrome without diarrhea: Secondary | ICD-10-CM | POA: Diagnosis not present

## 2020-03-22 ENCOUNTER — Ambulatory Visit (INDEPENDENT_AMBULATORY_CARE_PROVIDER_SITE_OTHER): Payer: Medicare Other | Admitting: Gastroenterology

## 2020-03-22 ENCOUNTER — Encounter: Payer: Self-pay | Admitting: Gastroenterology

## 2020-03-22 VITALS — BP 137/82 | HR 60 | Ht 66.0 in | Wt 146.4 lb

## 2020-03-22 DIAGNOSIS — R197 Diarrhea, unspecified: Secondary | ICD-10-CM

## 2020-03-22 MED ORDER — SUTAB 1479-225-188 MG PO TABS: 1.0000 | ORAL_TABLET | Freq: Once | ORAL | 0 refills | Status: AC

## 2020-03-22 NOTE — Patient Instructions (Signed)
If you are age 79 or older, your body mass index should be between 23-30. Your Body mass index is 23.63 kg/m. If this is out of the aforementioned range listed, please consider follow up with your Primary Care Provider.  If you are age 14 or younger, your body mass index should be between 19-25. Your Body mass index is 23.63 kg/m. If this is out of the aformentioned range listed, please consider follow up with your Primary Care Provider.   Complete stool studies ASAP.   You have been scheduled for a colonoscopy. Please follow written instructions given to you at your visit today.  Please pick up your prep supplies at the pharmacy within the next 1-3 days. If you use inhalers (even only as needed), please bring them with you on the day of your procedure.  Due to recent changes in healthcare laws, you [place] see the results of your imaging and laboratory studies on MyChart before your provider has had a chance to review them.  We understand that in some cases there Calmes be results that are confusing or concerning to you. Not all laboratory results come back in the same time frame and the provider Doering be waiting for multiple results in order to interpret others.  Please give [place] 48 hours in order for your provider to thoroughly review all the results before contacting the office for clarification of your results.

## 2020-03-22 NOTE — Progress Notes (Signed)
03/22/2020 Kristy Washington [id number] 11/17/1940   HISTORY OF PRESENT ILLNESS: This is a pleasant 79 year old female who is new to our office.  She is here today with complaints of diarrhea.  She says that this began in mid April.  She says that her PCP ordered stool studies, but when she attempted to obtain them apparently there was not enough stool so they were never successfully performed.  She says that she has been given several medications empirically to see if this would help with her symptoms.  First, she was given vancomycin 250 mg twice daily at the end of April for an unknown course of time.  Then she was placed on metronidazole 500 mg twice daily and Cipro 500 mg twice daily at the end of [place] for unknown course of time.  Then she was given Xifaxan 550 mg for 3 days from just samples in June and then it looks like eventually she was given more samples to extend the course.  During all these antibiotic she required fluconazole and nystatin for yeast issues.  In July she was given alprazolam for anxiety as well as Lomotil for the diarrhea and dicyclomine for abdominal discomfort.  She is also been using Metamucil and takes a probiotic.  She tells me that her last colonoscopy was with Dr. Theodosia Blender in [place], [place] about 9 years or so ago.  She tells me that she experienced nocturnal bowel movements with all of this.  Has some associated abdominal pain.  No blood in her stools.  She had a CT scan of the abdomen and pelvis with contrast on February 18, 2020 through [facility] in [place], [place], and that showed normal gastrointestinal system.    Past Medical History:  Diagnosis Date  . Bell's palsy 2009  . Bilateral renal cysts    SMALL - PER CT ABDOMEN REPORT 08/26/13  . DDD (degenerative disc disease), lumbar   . Diabetes mellitus without complication ([facility])   . Dysrhythmia    ATRIAL FIB - TAKES XARELTO  . H/O seasonal allergies   . Hearing loss in left ear    HEARING AID    . Hepatic cyst    "SCATTERED HEPATIC CYSTS HAVE SOME COMPLEX ELEMENTS" -PER CT ABD REPORT 08/26/13  . History of kidney stones    RIGHT STAGHORN CALCULUS AND ADDITIONAL SCATTERED SMALLER RIGHT KIDNEY LOWER POLE CALCULI  . Hypertension    PULMONARY HYPERTENSION  . Left ventricular hypertrophy   . Mitral valve regurgitation    MILD  . Pacemaker 11/02/2007   FOR SICK SINUS SYNDROME/ TACHY BRADY SYNDROME  . PFO (patent foramen ovale)    SMALL PFO  . Shingles 2009  . Shortness of breath    ONLY WITH INCLINES  . Tricuspid regurgitation    MILD - MOSERATE   Past Surgical History:  Procedure Laterality Date  . APPENDECTOMY    . CHOLECYSTECTOMY    . EYE SURGERY     BILATERAL CATARACT EXTRACTIONS WITH LENS IMPLANTS  . NEPHROLITHOTOMY Right 10/24/2013   Procedure: NEPHROLITHOTOMY PERCUTANEOUS;  Surgeon: [name], MD;  Location: [facility];  Service: Urology;  Laterality: Right;  . SURGERY FOR TUBAL PREGNANCY      reports that she has never smoked. She has never used smokeless tobacco. She reports that she does not drink alcohol and does not use drugs. family history includes Diabetes in her brother; Heart disease in her brother and maternal grandfather. Allergies  Allergen Reactions  . Actos [Pioglitazone]  Other (See Comments)    Fluid retention   . Adhesive [Tape] Rash      Outpatient Encounter Medications as of 03/22/2020  Medication Sig  . acetaminophen (TYLENOL) 325 MG tablet Take 650 mg by mouth every 6 (six) hours as needed.  Marland Kitchen alendronate (FOSAMAX) 70 MG tablet Take 70 mg by mouth every Friday. Take with a full glass of water on an empty stomach.   . ALPRAZolam (XANAX) 0.5 MG tablet Take 0.5 mg by mouth as needed for anxiety.  Marland Kitchen amLODipine (NORVASC) 2.5 MG tablet Take 2.5 mg by mouth every morning.   . diltiazem (CARDIZEM CD) 240 MG 24 hr capsule Take 240 mg by mouth every morning.  . diphenoxylate-atropine (LOMOTIL) 2.5-0.025 MG tablet Take by mouth as needed for diarrhea  or loose stools.  Marland Kitchen glimepiride (AMARYL) 1 MG tablet Take 1 mg by mouth daily with breakfast.  . lisinopril (PRINIVIL,ZESTRIL) 20 MG tablet Take 20 mg by mouth 2 (two) times daily.  . potassium chloride SA (K-DUR,KLOR-CON) 20 MEQ tablet Take 20 mEq by mouth daily.  . Probiotic Product (PROBIOTIC ADVANCED PO) Take 1 tablet by mouth daily.  . psyllium (METAMUCIL) 58.6 % packet Take 1 packet by mouth daily.  . simvastatin (ZOCOR) 5 MG tablet Take 5 mg by mouth at bedtime.  . sotalol (BETAPACE) 80 MG tablet Take 80-120 mg by mouth 2 (two) times daily. 120mg  in the am and 80 mg in the pm  . [DISCONTINUED] acetaminophen (TYLENOL) 325 MG tablet Take 650 mg by mouth every 6 (six) hours as needed for moderate pain.  Marland Kitchen CALCIUM-VITAMIN D PO Take 1 tablet by mouth daily.  Marland Kitchen HYDROcodone-acetaminophen (NORCO/VICODIN) 5-325 MG per tablet Take 1-2 tablets by mouth every 6 (six) hours as needed.  . [DISCONTINUED] metFORMIN (GLUCOPHAGE) 1000 MG tablet Take 500 mg by mouth 2 (two) times daily with a meal. (Patient not taking: Reported on 03/22/2020)  . [DISCONTINUED] metoprolol (LOPRESSOR) 50 MG tablet Take 50 mg by mouth 2 (two) times daily.  . [DISCONTINUED] Omega-3 Fatty Acids (FISH OIL) 1000 MG CAPS Take 1,000 mg by mouth daily.   No facility-administered encounter medications on file as of 03/22/2020.     REVIEW OF SYSTEMS  : All other systems reviewed and negative except where noted in the History of Present Illness.   PHYSICAL EXAM: BP 137/82   Pulse 60   Ht 5\' 6"  (1.676 m)   Wt 146 lb 6.4 oz (66.4 kg)   SpO2 97%   BMI 23.63 kg/m  General: Well developed white female in no acute distress Head: Normocephalic and atraumatic Eyes:  Sclerae anicteric, conjunctiva pink. Ears: Normal auditory acuity Lungs: Clear throughout to auscultation; no increased WOB. Heart: Regular rate and rhythm; no M/R/G. Abdomen: Soft, non-distended.  BS present.  Non-tender. Rectal:  Will be done at the time of  colonoscopy. Musculoskeletal: Symmetrical with no gross deformities  Skin: No lesions on visible extremities Extremities: No edema  Neurological: Alert oriented x 4, grossly non-focal Psychological:  Alert and cooperative. Normal mood and affect  ASSESSMENT AND PLAN: *Diarrhea with some associated abdominal pain: Present since mid April.  Multiple empiric regimens given by her PCP have been unsuccessful.  Stool studies not performed.  I think that we do need to at least rule out C. difficile as a potential cause.  We will plan for tentative colonoscopy several weeks out and she was advised to return a stool study soon as possible.  If she is negative for C.  difficile then we will in fact proceed with colonoscopy to rule out microscopic colitis, IBD, etc.  Will schedule with Dr. Silverio Decamp.  The risks, benefits, and alternatives to colonoscopy were discussed with the patient and she consents to proceed.    CC:  Glenda Chroman, MD

## 2020-03-23 ENCOUNTER — Other Ambulatory Visit: Payer: Medicare Other

## 2020-03-24 LAB — CLOSTRIDIUM DIFFICILE TOXIN B, QUALITATIVE, REAL-TIME PCR: Toxigenic C. Difficile by PCR: DETECTED — AB

## 2020-03-26 ENCOUNTER — Telehealth: Payer: Self-pay | Admitting: Gastroenterology

## 2020-03-26 ENCOUNTER — Other Ambulatory Visit: Payer: Self-pay | Admitting: Gastroenterology

## 2020-03-26 DIAGNOSIS — A0472 Enterocolitis due to Clostridium difficile, not specified as recurrent: Secondary | ICD-10-CM

## 2020-03-26 MED ORDER — VANCOMYCIN HCL 125 MG PO CAPS: 125.0000 mg | ORAL_CAPSULE | Freq: Four times a day (QID) | ORAL | 0 refills | Status: AC

## 2020-03-26 NOTE — Telephone Encounter (Signed)
C diff test came back positive. PA Zehr is out of the office today and result was sent to me as DOD. I called the patient. She states she has had C Diff in the past, although hard for her to clarify when and what she had for last therapy. She has had persistent diarrhea over the weekend. Recommend giving vancomycin 125mg  every 6 hours for 10 days to start. If this is first or second recurrence Marshman need prolonged taper. She verbalized understanding and agreed. Our staff will call her in a few days to see how she is doing. I told her to stop lomotil now that we know she has C diff.  Patty can you please contact this patient in a few days and see how she is doing.  Crecencio Mc, I covered this result today. Can you please finish your clinic documentation of her recent appointment and clarify if this is her first or second recurrence, need to consider a prolonged taper if she has not had that yet. Thanks

## 2020-03-28 ENCOUNTER — Encounter: Payer: Self-pay | Admitting: Gastroenterology

## 2020-03-28 DIAGNOSIS — R197 Diarrhea, unspecified: Secondary | ICD-10-CM | POA: Insufficient documentation

## 2020-03-28 NOTE — Telephone Encounter (Signed)
Noted thank you

## 2020-03-28 NOTE — Telephone Encounter (Signed)
Okay thanks for the update.   Patty can you please follow up with this patient and see how she is doing after starting therapy. Otherwise I think this Kipper be Dr. Woodward Ku patient if she was assigned to her during the clinic visit. Thanks

## 2020-03-28 NOTE — Telephone Encounter (Signed)
Thank you for addressing this positive C. difficile stool study.  I saw her last week.  She lives in [place], [place] and came with no previous records.  I have no documentation that she has ever been treated for C. difficile in the past or what she had been treated with.  I do have an extensive list of what they treated her with recently (she had written it down for me) beginning in April when her diarrhea began, which includes vancomycin (BID dosing), metronidazole, Cipro, Xifaxan, Lomotil, Metamucil, and fluconazole, but these were all just empiric treatments as no stool studies had been successfully performed.  At this point I think that is probably okay to treat with the vancomycin dosing as you recommended.

## 2020-03-28 NOTE — Telephone Encounter (Signed)
Patty, I have Heather contacting her so you do not need to call her.  Thank you,  Jess

## 2020-03-30 NOTE — Telephone Encounter (Signed)
I spoke with the pt and she has some questions regarding probiotic with C diff.  She was advised that she can continue taking the probiotic.  The pt has been advised of the information and verbalized understanding.

## 2020-03-30 NOTE — Telephone Encounter (Signed)
Patient called states she has questions on current treatment and\or plan.

## 2020-04-03 ENCOUNTER — Telehealth: Payer: Self-pay | Admitting: Gastroenterology

## 2020-04-03 NOTE — Telephone Encounter (Signed)
Pt is requesting a call back from a nurse, pt is experiencing nausea, but she would like some information on what she could take.

## 2020-04-03 NOTE — Telephone Encounter (Signed)
Spoke with patient, she reports nausea that has been going for a little over a week, no vomiting, no abdominal pain.  Pt states that she has tried ginger ale and peppermint, pt states that if she is able to belch she feels better. Advised patient to try Dramamine OTC for a few days, advised that if she does not have any relief to give [place] a call back.

## 2020-04-04 ENCOUNTER — Ambulatory Visit: Payer: Medicare Other | Admitting: Physician Assistant

## 2020-04-05 NOTE — Telephone Encounter (Signed)
Please let her know that her bowel habits Luper not return completely to normal right away and it Perdew take several weeks for them to do so.  It sounds like she is improving which is great.  Please have her begin taking Florastor probiotic twice daily.  Just continue to monitor and observe her symptoms for now and otherwise let us know if she again begins having any worsening symptoms or recurrent diarrhea.  Thank you,  Jess

## 2020-04-05 NOTE — Telephone Encounter (Signed)
Spoke with patient regarding recommendations, advised patient to get OTC Florastor probiotic and to take twice a day, advised patient to give it a couple of weeks that things will not come back to normal right away.Advised patient to just monitor her symptoms. Advised that if she develops worsening symptoms or diarrhea she should give [place] a call.

## 2020-04-05 NOTE — Telephone Encounter (Signed)
Spoke with patient, she reports that she will finish vancomycin for C. Diff today - she has two more pills. She states that most bowel movements are in the morning, about 3-4 times a day.  Pt states that stool does have some formation now but it "just slides out" - she states that this has gotten better because before "it would just go everywhere". She reports nausea, belching, no abdominal pain - full feeling sometimes, pt states that she has not seen any blood in the stool. Please advise, thank you

## 2020-04-06 NOTE — Progress Notes (Signed)
Reviewed and agree with documentation and assessment and plan. K. Veena Holger Sokolowski , MD   

## 2020-04-09 ENCOUNTER — Telehealth: Payer: Self-pay | Admitting: Gastroenterology

## 2020-04-09 NOTE — Telephone Encounter (Signed)
Spoke with patient, she reports that she is still having diarrhea, advised pt that since she had C. Diff it will take some time for her bowels to return back to normal. Pt states that she is taking probiotic Florastor one in the morning and once in the evening, pt states that she has lomotil. Pt advised to take Lomotil for a few days and see if she has any improvement, if no improvement advised that she call back and we will then forward to [place] Zehr-PA for further recommendations.

## 2020-04-10 ENCOUNTER — Telehealth: Payer: Self-pay | Admitting: Gastroenterology

## 2020-04-10 MED ORDER — FIDAXOMICIN 200 MG PO TABS: 200.0000 mg | ORAL_TABLET | Freq: Two times a day (BID) | ORAL | 0 refills | Status: DC

## 2020-04-10 NOTE — Telephone Encounter (Signed)
Then we will have to do vancomycin taper with 125 mg 4 times daily for 14 days, then 125 mg twice daily for 7 days, then  125 mg once daily for 7 days, then 125 mg every 2 days for 4 weeks.

## 2020-04-10 NOTE — Telephone Encounter (Signed)
Patient called to let us know medication cost $1200. She can not afford this. Please advise.

## 2020-04-10 NOTE — Addendum Note (Signed)
Addended by: Horris Latino on: 04/10/2020 04:26 PM   Modules accepted: Orders

## 2020-04-10 NOTE — Telephone Encounter (Signed)
Sent in script for Dificd. Patient informed we will treat her again and for [place] to call back with an update in 10 days. Patient voiced understanding.

## 2020-04-10 NOTE — Telephone Encounter (Signed)
Let's treat her again with dificid 200 mg BID for 10 days.  Continue her Florastor.

## 2020-04-11 ENCOUNTER — Telehealth: Payer: Self-pay | Admitting: Gastroenterology

## 2020-04-11 MED ORDER — VANCOMYCIN HCL 125 MG PO CAPS: ORAL_CAPSULE | ORAL | 0 refills | Status: AC

## 2020-04-11 NOTE — Telephone Encounter (Signed)
Informed patient of antibiotic taper. Patient voiced understanding.

## 2020-04-11 NOTE — Telephone Encounter (Signed)
Sent script for Vancomycin taper to [facility].

## 2020-04-11 NOTE — Telephone Encounter (Signed)
Spoke with patient and went over the instructions again for the Vancomycin again.

## 2020-04-11 NOTE — Telephone Encounter (Signed)
Pt is requesting a call back from a nurse regarding her pro biotics.

## 2020-04-12 NOTE — Telephone Encounter (Signed)
Spoke with patient and reminded her to continue Probiotic.

## 2020-04-23 ENCOUNTER — Telehealth: Payer: Self-pay | Admitting: Gastroenterology

## 2020-04-23 NOTE — Telephone Encounter (Signed)
The pt had an episode of diarrhea last night and states she now has irritated hemorrhoid.  She has been advised to use prep H and tucks pads as well as sitz baths.  She has been told to pat dry not rub and keep the area clean.  She is going to try this and call back if she does not improve.  The pt has been advised of the information and verbalized understanding.

## 2020-05-10 ENCOUNTER — Telehealth: Payer: Self-pay | Admitting: Gastroenterology

## 2020-05-10 DIAGNOSIS — N390 Urinary tract infection, site not specified: Secondary | ICD-10-CM | POA: Diagnosis not present

## 2020-05-10 DIAGNOSIS — A0472 Enterocolitis due to Clostridium difficile, not specified as recurrent: Secondary | ICD-10-CM | POA: Diagnosis not present

## 2020-05-10 DIAGNOSIS — I4891 Unspecified atrial fibrillation: Secondary | ICD-10-CM | POA: Diagnosis not present

## 2020-05-10 DIAGNOSIS — E119 Type 2 diabetes mellitus without complications: Secondary | ICD-10-CM | POA: Diagnosis not present

## 2020-05-10 DIAGNOSIS — R309 Painful micturition, unspecified: Secondary | ICD-10-CM | POA: Diagnosis not present

## 2020-05-10 DIAGNOSIS — E7800 Pure hypercholesterolemia, unspecified: Secondary | ICD-10-CM | POA: Diagnosis not present

## 2020-05-10 NOTE — Telephone Encounter (Signed)
The pt has been advised that she can continue probiotics.  The pt has been advised of the information and verbalized understanding.

## 2020-05-10 NOTE — Telephone Encounter (Signed)
Pt has questions about the last part of her treatment with vancomycin. She wants to know if she can continue taking probiotics with that or if she should stop.

## 2020-05-11 ENCOUNTER — Telehealth: Payer: Self-pay | Admitting: Gastroenterology

## 2020-05-11 NOTE — Telephone Encounter (Signed)
Noted the pt has been advised that this will be added to her chart.

## 2020-05-11 NOTE — Telephone Encounter (Signed)
Pt is wanting to inform the nurse she has a UTI and she is taking the medication Nitrofurantoin mono 100mg 

## 2020-05-16 ENCOUNTER — Ambulatory Visit (INDEPENDENT_AMBULATORY_CARE_PROVIDER_SITE_OTHER): Payer: Medicare Other | Admitting: Gastroenterology

## 2020-05-16 ENCOUNTER — Encounter: Payer: Self-pay | Admitting: Gastroenterology

## 2020-05-16 VITALS — BP 96/56 | HR 72 | Ht 66.0 in | Wt 144.0 lb

## 2020-05-16 DIAGNOSIS — R197 Diarrhea, unspecified: Secondary | ICD-10-CM | POA: Diagnosis not present

## 2020-05-16 DIAGNOSIS — A0472 Enterocolitis due to Clostridium difficile, not specified as recurrent: Secondary | ICD-10-CM

## 2020-05-16 NOTE — Patient Instructions (Signed)
If you are age 79 or older, your body mass index should be between 23-30. Your Body mass index is 23.24 kg/m. If this is out of the aforementioned range listed, please consider follow up with your Primary Care Provider.  If you are age 32 or younger, your body mass index should be between 19-25. Your Body mass index is 23.24 kg/m. If this is out of the aformentioned range listed, please consider follow up with your Primary Care Provider.   Call the office after completing the Vancomycin with a update on how you are doing and discuss scheduling a colonoscopy at that time.  Continue your Probiotic daily.

## 2020-05-16 NOTE — Progress Notes (Signed)
05/16/2020 Kristy Washington [id number] Mar 11, 1941   HISTORY OF PRESENT ILLNESS:  This is a pleasant 79 year old female who was seen by me back in August for complaints of diarrhea.  She ended being positive for Cdiff.  Was treated with a 10 day course of vancomycin QID but symptoms, but symptoms persisted/returned quickly.  Dificid was then prescribed but it was too expensive so ultimately she was placed on a vancomycin taper.  She is currently on every other day dosing.  She notes that she is no longer having diarrhea.  Having some "pellet-like stools".  She denies seeing blood in her stool.  She is currently also on Cipro for UTI as well.  She is scheduled for colonoscopy in 2 weeks.  Her last colonoscopy was over 9 years ago.  She would like to consider having another one, but wants to fully recover from this C. difficile first, so would like to cancel that and wait to reschedule once her antibiotics have been completed.  She is on a daily probiotic.   Past Medical History:  Diagnosis Date  . Bell's palsy 2009  . Bilateral renal cysts    SMALL - PER CT ABDOMEN REPORT 08/26/13  . DDD (degenerative disc disease), lumbar   . Diabetes mellitus without complication ([place])   . Dysrhythmia    ATRIAL FIB - TAKES XARELTO  . H/O seasonal allergies   . Hearing loss in left ear    HEARING AID  . Hepatic cyst    "SCATTERED HEPATIC CYSTS HAVE SOME COMPLEX ELEMENTS" -PER CT ABD REPORT 08/26/13  . History of kidney stones    RIGHT STAGHORN CALCULUS AND ADDITIONAL SCATTERED SMALLER RIGHT KIDNEY LOWER POLE CALCULI  . Hypertension    PULMONARY HYPERTENSION  . Left ventricular hypertrophy   . Mitral valve regurgitation    MILD  . Pacemaker 11/02/2007   FOR SICK SINUS SYNDROME/ TACHY BRADY SYNDROME  . PFO (patent foramen ovale)    SMALL PFO  . Shingles 2009  . Shortness of breath    ONLY WITH INCLINES  . Tricuspid regurgitation    MILD - MOSERATE   Past Surgical History:  Procedure Laterality Date  .  APPENDECTOMY    . CHOLECYSTECTOMY    . EYE SURGERY     BILATERAL CATARACT EXTRACTIONS WITH LENS IMPLANTS  . NEPHROLITHOTOMY Right 10/24/2013   Procedure: NEPHROLITHOTOMY PERCUTANEOUS;  Surgeon: [name], MD;  Location: [facility];  Service: Urology;  Laterality: Right;  . SURGERY FOR TUBAL PREGNANCY      reports that she has never smoked. She has never used smokeless tobacco. She reports that she does not drink alcohol and does not use drugs. family history includes Diabetes in her brother; Heart disease in her brother and maternal grandfather. Allergies  Allergen Reactions  . Actos [Pioglitazone] Other (See Comments)    Fluid retention   . Adhesive [Tape] Rash      Outpatient Encounter Medications as of 05/16/2020  Medication Sig  . acetaminophen (TYLENOL) 325 MG tablet Take 650 mg by mouth every 6 (six) hours as needed.  Marland Kitchen alendronate (FOSAMAX) 70 MG tablet Take 70 mg by mouth every Friday. Take with a full glass of water on an empty stomach.   . ALPRAZolam (XANAX) 0.5 MG tablet Take 0.5 mg by mouth as needed for anxiety.  Marland Kitchen amLODipine (NORVASC) 2.5 MG tablet Take 2.5 mg by mouth every morning.   Marland Kitchen CALCIUM-VITAMIN D PO Take 1 tablet by mouth daily.  Marland Kitchen  diltiazem (CARDIZEM CD) 240 MG 24 hr capsule Take 240 mg by mouth every morning.  . diphenoxylate-atropine (LOMOTIL) 2.5-0.025 MG tablet Take by mouth as needed for diarrhea or loose stools.  Marland Kitchen glimepiride (AMARYL) 1 MG tablet Take 1 mg by mouth daily with breakfast.  . HYDROcodone-acetaminophen (NORCO/VICODIN) 5-325 MG per tablet Take 1-2 tablets by mouth every 6 (six) hours as needed.  Marland Kitchen lisinopril (PRINIVIL,ZESTRIL) 20 MG tablet Take 20 mg by mouth 2 (two) times daily.  . potassium chloride SA (K-DUR,KLOR-CON) 20 MEQ tablet Take 20 mEq by mouth daily.  . Probiotic Product (PROBIOTIC ADVANCED PO) Take 1 tablet by mouth daily.  . psyllium (METAMUCIL) 58.6 % packet Take 1 packet by mouth daily.  . simvastatin (ZOCOR) 5 MG tablet Take  5 mg by mouth at bedtime.  . sotalol (BETAPACE) 80 MG tablet Take 80-120 mg by mouth 2 (two) times daily. 120mg  in the am and 80 mg in the pm  . vancomycin (VANCOCIN) 125 MG capsule Take 1 capsule (125 mg total) by mouth 4 (four) times daily for 14 days, THEN 1 capsule (125 mg total) 2 (two) times daily for 7 days, THEN 1 capsule (125 mg total) daily for 7 days, THEN 1 capsule (125 mg total) every other day for 28 days.  . ciprofloxacin (CIPRO) 500 MG tablet Take 1 tablet by mouth 2 (two) times daily.   No facility-administered encounter medications on file as of 05/16/2020.     REVIEW OF SYSTEMS  : All other systems reviewed and negative except where noted in the History of Present Illness.   PHYSICAL EXAM: Ht 5\' 6"  (1.676 m)   Wt 144 lb (65.3 kg)   BMI 23.24 kg/m  General: Well developed white female in no acute distress Head: Normocephalic and atraumatic Eyes:  Sclerae anicteric, conjunctiva pink. Ears: Normal auditory acuity Lungs: Clear throughout to auscultation; no W/R/R. Heart: Regular rate and rhythm; no M/R/G. Abdomen: Soft, non-distended.  BS present.  Non-tender. Musculoskeletal: Symmetrical with no gross deformities  Skin: No lesions on visible extremities Extremities: No edema  Neurological: Alert oriented x 4, grossly non-focal Psychological:  Alert and cooperative. Normal mood and affect  ASSESSMENT AND PLAN: *Diarrhea/C. difficile colitis: Completed a course of vancomycin, but symptoms persisted/returned quickly.  Dificid was prescribed but was expensive.  She was then given a vancomycin taper.  She continues on that medication every other day at this point.  Diarrhea is much improved.  She is scheduled for colonoscopy in 2 weeks.  We are going to cancel that for now she would like to be sure she is fully recovered from this first.  She is going to continue her probiotic for the next several months as well.  She will call our office back couple weeks following  completion of treatment and give [place] an update on her symptoms.  If she is doing well she would like to consider undergoing colonoscopy.  Certainly she will call us back sooner if symptoms persists/return.   CC:  Glenda Chroman, MD

## 2020-05-22 DIAGNOSIS — I700 Atherosclerosis of aorta: Secondary | ICD-10-CM | POA: Diagnosis not present

## 2020-05-22 DIAGNOSIS — E1165 Type 2 diabetes mellitus with hyperglycemia: Secondary | ICD-10-CM | POA: Diagnosis not present

## 2020-05-22 DIAGNOSIS — I4891 Unspecified atrial fibrillation: Secondary | ICD-10-CM | POA: Diagnosis not present

## 2020-05-22 DIAGNOSIS — Z299 Encounter for prophylactic measures, unspecified: Secondary | ICD-10-CM | POA: Diagnosis not present

## 2020-05-28 ENCOUNTER — Ambulatory Visit (INDEPENDENT_AMBULATORY_CARE_PROVIDER_SITE_OTHER): Payer: Medicare Other | Admitting: Gastroenterology

## 2020-05-30 ENCOUNTER — Encounter: Payer: Self-pay | Admitting: Gastroenterology

## 2020-05-31 NOTE — Progress Notes (Signed)
Reviewed and agree with documentation and assessment and plan. K. Veena Arleatha Philipps , MD   

## 2020-06-02 DIAGNOSIS — Z23 Encounter for immunization: Secondary | ICD-10-CM | POA: Diagnosis not present

## 2020-06-08 DIAGNOSIS — E7800 Pure hypercholesterolemia, unspecified: Secondary | ICD-10-CM | POA: Diagnosis not present

## 2020-06-08 DIAGNOSIS — E119 Type 2 diabetes mellitus without complications: Secondary | ICD-10-CM | POA: Diagnosis not present

## 2020-06-08 DIAGNOSIS — I4891 Unspecified atrial fibrillation: Secondary | ICD-10-CM | POA: Diagnosis not present

## 2020-06-13 DIAGNOSIS — L905 Scar conditions and fibrosis of skin: Secondary | ICD-10-CM | POA: Diagnosis not present

## 2020-06-13 DIAGNOSIS — I10 Essential (primary) hypertension: Secondary | ICD-10-CM | POA: Diagnosis not present

## 2020-06-13 DIAGNOSIS — Z45010 Encounter for checking and testing of cardiac pacemaker pulse generator [battery]: Secondary | ICD-10-CM | POA: Diagnosis not present

## 2020-06-13 DIAGNOSIS — L814 Other melanin hyperpigmentation: Secondary | ICD-10-CM | POA: Diagnosis not present

## 2020-06-13 DIAGNOSIS — E785 Hyperlipidemia, unspecified: Secondary | ICD-10-CM | POA: Diagnosis not present

## 2020-06-13 DIAGNOSIS — L821 Other seborrheic keratosis: Secondary | ICD-10-CM | POA: Diagnosis not present

## 2020-06-13 DIAGNOSIS — R0600 Dyspnea, unspecified: Secondary | ICD-10-CM | POA: Diagnosis not present

## 2020-06-13 DIAGNOSIS — D485 Neoplasm of uncertain behavior of skin: Secondary | ICD-10-CM | POA: Diagnosis not present

## 2020-06-13 DIAGNOSIS — I272 Pulmonary hypertension, unspecified: Secondary | ICD-10-CM | POA: Diagnosis not present

## 2020-06-13 DIAGNOSIS — D229 Melanocytic nevi, unspecified: Secondary | ICD-10-CM | POA: Diagnosis not present

## 2020-06-13 DIAGNOSIS — Z85828 Personal history of other malignant neoplasm of skin: Secondary | ICD-10-CM | POA: Diagnosis not present

## 2020-06-13 DIAGNOSIS — I4891 Unspecified atrial fibrillation: Secondary | ICD-10-CM | POA: Diagnosis not present

## 2020-06-13 DIAGNOSIS — L80 Vitiligo: Secondary | ICD-10-CM | POA: Diagnosis not present

## 2020-06-13 DIAGNOSIS — L738 Other specified follicular disorders: Secondary | ICD-10-CM | POA: Diagnosis not present

## 2020-06-13 DIAGNOSIS — R58 Hemorrhage, not elsewhere classified: Secondary | ICD-10-CM | POA: Diagnosis not present

## 2020-06-13 DIAGNOSIS — D1801 Hemangioma of skin and subcutaneous tissue: Secondary | ICD-10-CM | POA: Diagnosis not present

## 2020-06-13 DIAGNOSIS — Z950 Presence of cardiac pacemaker: Secondary | ICD-10-CM | POA: Diagnosis not present

## 2020-06-22 ENCOUNTER — Telehealth: Payer: Self-pay | Admitting: Gastroenterology

## 2020-06-22 NOTE — Telephone Encounter (Signed)
The pt has been advised.  She is taking florastor BID and has an app tot follow up with Janett Billow.

## 2020-06-22 NOTE — Telephone Encounter (Signed)
Pt finished abx for C diff  on October 23. She was asked to call with an update.  She had 2 episodes of loose stools yesterday. She has had small "nuggets" otherwise. Please advise

## 2020-06-22 NOTE — Telephone Encounter (Signed)
Pt would like to inform the nurse that she last took her medication Vancomycin on 10/23. Pt states her bowel movements are small and yesterday she experienced loose stools, pt would like to discuss further with a nurse.

## 2020-06-22 NOTE — Telephone Encounter (Signed)
Would just continue to monitor for now.  It Autrey take months for things to return to complete normal.  She should be taking Florastor probiotic BID.  Please have her call back if significant loose stools/diarrhea returns but otherwise please schedule her for an OV with me in several weeks to come in and schedule a colonoscopy if she is willing.   Thank you,  Jess

## 2020-06-26 DIAGNOSIS — H6592 Unspecified nonsuppurative otitis media, left ear: Secondary | ICD-10-CM | POA: Diagnosis not present

## 2020-07-06 DIAGNOSIS — Z23 Encounter for immunization: Secondary | ICD-10-CM | POA: Diagnosis not present

## 2020-07-10 DIAGNOSIS — E7800 Pure hypercholesterolemia, unspecified: Secondary | ICD-10-CM | POA: Diagnosis not present

## 2020-07-10 DIAGNOSIS — I4891 Unspecified atrial fibrillation: Secondary | ICD-10-CM | POA: Diagnosis not present

## 2020-07-10 DIAGNOSIS — E119 Type 2 diabetes mellitus without complications: Secondary | ICD-10-CM | POA: Diagnosis not present

## 2020-07-27 ENCOUNTER — Ambulatory Visit (INDEPENDENT_AMBULATORY_CARE_PROVIDER_SITE_OTHER): Payer: Medicare Other | Admitting: Gastroenterology

## 2020-07-27 ENCOUNTER — Encounter: Payer: Self-pay | Admitting: Gastroenterology

## 2020-07-27 VITALS — BP 138/64 | HR 72 | Wt 147.0 lb

## 2020-07-27 DIAGNOSIS — R197 Diarrhea, unspecified: Secondary | ICD-10-CM

## 2020-07-27 DIAGNOSIS — Z8619 Personal history of other infectious and parasitic diseases: Secondary | ICD-10-CM

## 2020-07-27 MED ORDER — CHOLESTYRAMINE 4 G PO PACK: 4.0000 g | PACK | Freq: Every day | ORAL | 2 refills | Status: DC

## 2020-07-27 NOTE — Progress Notes (Signed)
07/27/2020 Kristy Washington [id number] Apr 26, 1941   HISTORY OF PRESENT ILLNESS:  This is a pleasant 79 year old female who has been treated for Cdiff over the past few months.  She was initially seen in August for complaints of diarrhea.  She ended being positive for Cdiff.  Was treated with a 10 day course of vancomycin QID, but symptoms persisted/returned quickly.  Dificid was then prescribed but it was too expensive so ultimately she was placed on a vancomycin taper, which she completed several weeks ago now.  She remains on Florastor twice daily.  She continues to have loose stools, usually on average twice a day.  She does have some normal bowel movements as well.  She denies any fever, chills, abdominal pain, rectal bleeding.  Says her appetite is getting better.  Her weight is up a few pounds.  She occasionally uses a Lomotil if needed.  She's had a couple of episodes of incontinence and is depressed about that and gets nervous about leaving the house, etc.  Past Medical History:  Diagnosis Date  . Bell's palsy 2009  . Bilateral renal cysts    SMALL - PER CT ABDOMEN REPORT 08/26/13  . DDD (degenerative disc disease), lumbar   . Diabetes mellitus without complication ([place])   . Dysrhythmia    ATRIAL FIB - TAKES XARELTO  . H/O seasonal allergies   . Hearing loss in left ear    HEARING AID  . Hepatic cyst    "SCATTERED HEPATIC CYSTS HAVE SOME COMPLEX ELEMENTS" -PER CT ABD REPORT 08/26/13  . History of kidney stones    RIGHT STAGHORN CALCULUS AND ADDITIONAL SCATTERED SMALLER RIGHT KIDNEY LOWER POLE CALCULI  . Hypertension    PULMONARY HYPERTENSION  . Left ventricular hypertrophy   . Mitral valve regurgitation    MILD  . Pacemaker 11/02/2007   FOR SICK SINUS SYNDROME/ TACHY BRADY SYNDROME  . PFO (patent foramen ovale)    SMALL PFO  . Shingles 2009  . Shortness of breath    ONLY WITH INCLINES  . Tricuspid regurgitation    MILD - MOSERATE   Past Surgical History:  Procedure  Laterality Date  . APPENDECTOMY    . BUNIONECTOMY Right   . CHOLECYSTECTOMY    . EYE SURGERY     BILATERAL CATARACT EXTRACTIONS WITH LENS IMPLANTS  . NEPHROLITHOTOMY Right 10/24/2013   Procedure: NEPHROLITHOTOMY PERCUTANEOUS;  Surgeon: [name], MD;  Location: [facility];  Service: Urology;  Laterality: Right;  . SURGERY FOR TUBAL PREGNANCY      reports that she has never smoked. She has never used smokeless tobacco. She reports that she does not drink alcohol and does not use drugs. family history includes Breast cancer in her daughter; Diabetes in her brother; Heart disease in her brother and maternal grandfather. Allergies  Allergen Reactions  . Actos [Pioglitazone] Other (See Comments)    Fluid retention   . Adhesive [Tape] Rash      Outpatient Encounter Medications as of 07/27/2020  Medication Sig  . acetaminophen (TYLENOL) 325 MG tablet Take 650 mg by mouth every 6 (six) hours as needed.  Marland Kitchen alendronate (FOSAMAX) 70 MG tablet Take 70 mg by mouth every Friday. Take with a full glass of water on an empty stomach.  . ALPRAZolam (XANAX) 0.5 MG tablet Take 0.5 mg by mouth as needed for anxiety.  Marland Kitchen amLODipine (NORVASC) 2.5 MG tablet Take 2.5 mg by mouth every morning.   Marland Kitchen CALCIUM-VITAMIN D PO Take 1  tablet by mouth daily.  Marland Kitchen diltiazem (CARDIZEM CD) 240 MG 24 hr capsule Take 240 mg by mouth every morning.  . diphenoxylate-atropine (LOMOTIL) 2.5-0.025 MG tablet Take by mouth as needed for diarrhea or loose stools.  Marland Kitchen glimepiride (AMARYL) 1 MG tablet Take 1 mg by mouth daily with breakfast.  . HYDROcodone-acetaminophen (NORCO/VICODIN) 5-325 MG per tablet Take 1-2 tablets by mouth every 6 (six) hours as needed.  Marland Kitchen lisinopril (PRINIVIL,ZESTRIL) 20 MG tablet Take 20 mg by mouth 2 (two) times daily.  . potassium chloride SA (K-DUR,KLOR-CON) 20 MEQ tablet Take 20 mEq by mouth daily.  . Probiotic Product (PROBIOTIC ADVANCED PO) Take 1 tablet by mouth daily.  . psyllium (METAMUCIL) 58.6 %  packet Take 1 packet by mouth daily.  Marland Kitchen saccharomyces boulardii (FLORASTOR) 250 MG capsule Take 250 mg by mouth 2 (two) times daily.  . simvastatin (ZOCOR) 5 MG tablet Take 5 mg by mouth at bedtime.  . sotalol (BETAPACE) 80 MG tablet Take 80-120 mg by mouth 2 (two) times daily. 120mg  in the am and 80 mg in the pm  . [DISCONTINUED] ciprofloxacin (CIPRO) 500 MG tablet Take 1 tablet by mouth 2 (two) times daily.   No facility-administered encounter medications on file as of 07/27/2020.     REVIEW OF SYSTEMS  : All other systems reviewed and negative except where noted in the History of Present Illness.   PHYSICAL EXAM: BP 138/64   Pulse 72   Wt 147 lb (66.7 kg)   BMI 23.73 kg/m  General: Well developed white female in no acute distress Head: Normocephalic and atraumatic Eyes:  Sclerae anicteric, conjunctiva pink. Ears: Normal auditory acuity Lungs: Clear throughout to auscultation; no W/R/R. Heart: Regular rate and rhythm; no M/R/G. Abdomen: Soft, non-distended.  BS present.  Minimal lower abdominal TTP. Musculoskeletal: Symmetrical with no gross deformities  Skin: No lesions on visible extremities Extremities: No edema  Neurological: Alert oriented x 4, grossly non-focal Psychological:  Alert and cooperative. Normal mood and affect  ASSESSMENT AND PLAN: *Diarrhea/C diff colitis:  Completed vancomycin taper.  She is definitely improved but still having some diarrhea.  Usually only a couple of bowel movements a day and some are formed as well.  No pain.  Overall feeling better, but just depressed about ongoing worry about having incontinence, etc.  Appetite is getting better and weight is up a couple of pounds.  I do not think that she still has active Cdiff infection.  Likely some post-infectious symptoms.  She will continue Florastor BID for now.  She wants to hold off on colonoscopy for now as well.  I am going to put her on questran, one packet daily at lunchtime for now to see if  that helps to firm her stools up more.  Prescription sent to pharmacy.  She will call us back in a few weeks with an update or sooner if needed.   CC:  Glenda Chroman, MD

## 2020-07-27 NOTE — Patient Instructions (Signed)
If you are age 79 or older, your body mass index should be between 23-30. Your Body mass index is 23.73 kg/m. If this is out of the aforementioned range listed, please consider follow up with your Primary Care Provider.  If you are age 10 or younger, your body mass index should be between 19-25. Your Body mass index is 23.73 kg/m. If this is out of the aformentioned range listed, please consider follow up with your Primary Care Provider.   We have sent the following medications to your pharmacy for you to pick up at your convenience: Questran  Please call back in 2-3 weeks with an update.  It was a pleasure to see you today!  Alonza Bogus, PA-C

## 2020-08-09 DIAGNOSIS — E119 Type 2 diabetes mellitus without complications: Secondary | ICD-10-CM | POA: Diagnosis not present

## 2020-08-09 DIAGNOSIS — I4891 Unspecified atrial fibrillation: Secondary | ICD-10-CM | POA: Diagnosis not present

## 2020-08-09 DIAGNOSIS — E7800 Pure hypercholesterolemia, unspecified: Secondary | ICD-10-CM | POA: Diagnosis not present

## 2020-08-17 ENCOUNTER — Telehealth: Payer: Self-pay | Admitting: Gastroenterology

## 2020-08-30 DIAGNOSIS — H43813 Vitreous degeneration, bilateral: Secondary | ICD-10-CM | POA: Diagnosis not present

## 2020-08-30 DIAGNOSIS — H532 Diplopia: Secondary | ICD-10-CM | POA: Diagnosis not present

## 2020-08-30 DIAGNOSIS — H52203 Unspecified astigmatism, bilateral: Secondary | ICD-10-CM | POA: Diagnosis not present

## 2020-08-30 DIAGNOSIS — E119 Type 2 diabetes mellitus without complications: Secondary | ICD-10-CM | POA: Diagnosis not present

## 2020-09-05 DIAGNOSIS — H532 Diplopia: Secondary | ICD-10-CM | POA: Diagnosis not present

## 2020-09-05 DIAGNOSIS — I10 Essential (primary) hypertension: Secondary | ICD-10-CM | POA: Diagnosis not present

## 2020-09-05 DIAGNOSIS — I700 Atherosclerosis of aorta: Secondary | ICD-10-CM | POA: Diagnosis not present

## 2020-09-05 DIAGNOSIS — E1165 Type 2 diabetes mellitus with hyperglycemia: Secondary | ICD-10-CM | POA: Diagnosis not present

## 2020-09-05 DIAGNOSIS — Z299 Encounter for prophylactic measures, unspecified: Secondary | ICD-10-CM | POA: Diagnosis not present

## 2020-09-05 DIAGNOSIS — F419 Anxiety disorder, unspecified: Secondary | ICD-10-CM | POA: Diagnosis not present

## 2020-09-24 ENCOUNTER — Telehealth: Payer: Self-pay | Admitting: Gastroenterology

## 2020-09-24 NOTE — Telephone Encounter (Signed)
Patient called to let Janett Billow know that she is doing better on the Questran. She is no longer having runny diarrhea. Stools have firmed up but she is having bowel movements about 6 times daily. Patient wanted to know if she should taper off of Questran like she did last time. Please advise.

## 2020-09-24 NOTE — Telephone Encounter (Signed)
Pt is requesting a call back from a nurse to discuss her QUESTRAN, pt would like to know If she should continue taking this medicine.

## 2020-09-26 NOTE — Telephone Encounter (Signed)
6 bowel movements a day is still quite a bit although I know she says they are firming up.  She can certainly reduce to half a pack daily for a week or 2 and see how she does and then discontinue it altogether.  If diarrhea recurs and I would like to repeat a stool study for C. difficile again so she needs to keep [place] updated on how she is doing.

## 2020-09-28 DIAGNOSIS — Z79899 Other long term (current) drug therapy: Secondary | ICD-10-CM | POA: Diagnosis not present

## 2020-09-28 DIAGNOSIS — Z0000 Encounter for general adult medical examination without abnormal findings: Secondary | ICD-10-CM | POA: Diagnosis not present

## 2020-09-28 DIAGNOSIS — Z1339 Encounter for screening examination for other mental health and behavioral disorders: Secondary | ICD-10-CM | POA: Diagnosis not present

## 2020-09-28 DIAGNOSIS — Z6822 Body mass index (BMI) 22.0-22.9, adult: Secondary | ICD-10-CM | POA: Diagnosis not present

## 2020-09-28 DIAGNOSIS — Z7189 Other specified counseling: Secondary | ICD-10-CM | POA: Diagnosis not present

## 2020-09-28 DIAGNOSIS — R5383 Other fatigue: Secondary | ICD-10-CM | POA: Diagnosis not present

## 2020-09-28 DIAGNOSIS — Z1331 Encounter for screening for depression: Secondary | ICD-10-CM | POA: Diagnosis not present

## 2020-09-28 DIAGNOSIS — I10 Essential (primary) hypertension: Secondary | ICD-10-CM | POA: Diagnosis not present

## 2020-09-28 DIAGNOSIS — E7800 Pure hypercholesterolemia, unspecified: Secondary | ICD-10-CM | POA: Diagnosis not present

## 2020-09-28 DIAGNOSIS — Z299 Encounter for prophylactic measures, unspecified: Secondary | ICD-10-CM | POA: Diagnosis not present

## 2020-09-28 NOTE — Telephone Encounter (Signed)
Called and spoke with patient and let her know that she could reduce Questran packets by half and then she Hallgren discontinue the packets all together . Also told patient keep [place] updated on rather the diarrhea recurs.

## 2020-10-08 NOTE — Progress Notes (Signed)
Reviewed and agree with documentation and assessment and plan. K. Veena Percy Winterrowd , MD   

## 2020-10-12 DIAGNOSIS — M818 Other osteoporosis without current pathological fracture: Secondary | ICD-10-CM | POA: Diagnosis not present

## 2020-10-12 DIAGNOSIS — E2839 Other primary ovarian failure: Secondary | ICD-10-CM | POA: Diagnosis not present

## 2020-10-23 DIAGNOSIS — Z1231 Encounter for screening mammogram for malignant neoplasm of breast: Secondary | ICD-10-CM | POA: Diagnosis not present

## 2020-10-24 ENCOUNTER — Other Ambulatory Visit: Payer: Self-pay | Admitting: Gastroenterology

## 2020-10-29 DIAGNOSIS — B351 Tinea unguium: Secondary | ICD-10-CM | POA: Diagnosis not present

## 2020-10-29 DIAGNOSIS — E1142 Type 2 diabetes mellitus with diabetic polyneuropathy: Secondary | ICD-10-CM | POA: Diagnosis not present

## 2020-10-29 DIAGNOSIS — L84 Corns and callosities: Secondary | ICD-10-CM | POA: Diagnosis not present

## 2020-10-29 DIAGNOSIS — M79676 Pain in unspecified toe(s): Secondary | ICD-10-CM | POA: Diagnosis not present

## 2020-11-09 ENCOUNTER — Telehealth: Payer: Self-pay | Admitting: Gastroenterology

## 2020-11-09 ENCOUNTER — Other Ambulatory Visit: Payer: Self-pay

## 2020-11-09 DIAGNOSIS — A0472 Enterocolitis due to Clostridium difficile, not specified as recurrent: Secondary | ICD-10-CM

## 2020-11-09 NOTE — Telephone Encounter (Signed)
Patient is returning your call.  

## 2020-11-09 NOTE — Telephone Encounter (Signed)
Yes, she needs to be checked for Cdiff again.  Please order Cdiff PCR.  But she can still use the Lomotil prn for the weekend as well.

## 2020-11-09 NOTE — Telephone Encounter (Signed)
Called patient and let her know that it was okay to take Lomotil for the weekend.

## 2020-11-09 NOTE — Telephone Encounter (Signed)
Placed orders for Cdiff PCR and called patient to inform her.

## 2020-11-09 NOTE — Telephone Encounter (Signed)
Patient's Diarrhea has returned again and she wanted to know if she could take Lomotil for the weekend. She mentioned when she called last time that you wanted to do stool studies if diarrhea returned.

## 2020-11-09 NOTE — Telephone Encounter (Signed)
Called twice to return patients call but got no answer and VM has not been set up.

## 2020-11-12 NOTE — Telephone Encounter (Signed)
Called patient and spoke with her letting her know to come by the lab this week and pick up supplies to complete cdiff PCR. Patient stated she would come to the lab today.

## 2020-11-13 ENCOUNTER — Other Ambulatory Visit: Payer: Medicare Other

## 2020-11-13 DIAGNOSIS — A0472 Enterocolitis due to Clostridium difficile, not specified as recurrent: Secondary | ICD-10-CM | POA: Diagnosis not present

## 2020-11-14 LAB — CLOSTRIDIUM DIFFICILE TOXIN B, QUALITATIVE, REAL-TIME PCR: Toxigenic C. Difficile by PCR: NOT DETECTED

## 2020-11-16 ENCOUNTER — Other Ambulatory Visit: Payer: Self-pay

## 2020-11-16 DIAGNOSIS — R197 Diarrhea, unspecified: Secondary | ICD-10-CM

## 2020-11-16 DIAGNOSIS — Z8619 Personal history of other infectious and parasitic diseases: Secondary | ICD-10-CM

## 2020-11-16 MED ORDER — SUTAB 1479-225-188 MG PO TABS: 1.0000 | ORAL_TABLET | Freq: Once | ORAL | 0 refills | Status: AC

## 2020-12-11 DIAGNOSIS — I10 Essential (primary) hypertension: Secondary | ICD-10-CM | POA: Diagnosis not present

## 2020-12-11 DIAGNOSIS — I4891 Unspecified atrial fibrillation: Secondary | ICD-10-CM | POA: Diagnosis not present

## 2020-12-11 DIAGNOSIS — Z299 Encounter for prophylactic measures, unspecified: Secondary | ICD-10-CM | POA: Diagnosis not present

## 2020-12-11 DIAGNOSIS — I700 Atherosclerosis of aorta: Secondary | ICD-10-CM | POA: Diagnosis not present

## 2020-12-11 DIAGNOSIS — E1165 Type 2 diabetes mellitus with hyperglycemia: Secondary | ICD-10-CM | POA: Diagnosis not present

## 2020-12-26 DIAGNOSIS — I272 Pulmonary hypertension, unspecified: Secondary | ICD-10-CM | POA: Diagnosis not present

## 2020-12-26 DIAGNOSIS — E785 Hyperlipidemia, unspecified: Secondary | ICD-10-CM | POA: Diagnosis not present

## 2020-12-26 DIAGNOSIS — I4891 Unspecified atrial fibrillation: Secondary | ICD-10-CM | POA: Diagnosis not present

## 2020-12-26 DIAGNOSIS — R0600 Dyspnea, unspecified: Secondary | ICD-10-CM | POA: Diagnosis not present

## 2020-12-26 DIAGNOSIS — Z45010 Encounter for checking and testing of cardiac pacemaker pulse generator [battery]: Secondary | ICD-10-CM | POA: Diagnosis not present

## 2020-12-26 DIAGNOSIS — Z950 Presence of cardiac pacemaker: Secondary | ICD-10-CM | POA: Diagnosis not present

## 2020-12-26 DIAGNOSIS — I10 Essential (primary) hypertension: Secondary | ICD-10-CM | POA: Diagnosis not present

## 2021-01-02 DIAGNOSIS — B351 Tinea unguium: Secondary | ICD-10-CM | POA: Diagnosis not present

## 2021-01-02 DIAGNOSIS — L84 Corns and callosities: Secondary | ICD-10-CM | POA: Diagnosis not present

## 2021-01-02 DIAGNOSIS — E1142 Type 2 diabetes mellitus with diabetic polyneuropathy: Secondary | ICD-10-CM | POA: Diagnosis not present

## 2021-01-02 DIAGNOSIS — M79676 Pain in unspecified toe(s): Secondary | ICD-10-CM | POA: Diagnosis not present

## 2021-01-23 DIAGNOSIS — H532 Diplopia: Secondary | ICD-10-CM | POA: Diagnosis not present

## 2021-01-28 DIAGNOSIS — H5021 Vertical strabismus, right eye: Secondary | ICD-10-CM | POA: Diagnosis not present

## 2021-01-28 DIAGNOSIS — H532 Diplopia: Secondary | ICD-10-CM | POA: Diagnosis not present

## 2021-01-31 ENCOUNTER — Telehealth: Payer: Self-pay | Admitting: Gastroenterology

## 2021-01-31 NOTE — Telephone Encounter (Signed)
Called patient back and gave Kristy Washington's comments. Patient is already scheduled for an Colonoscopy on 02/27/21 in our [facility] with Dr. Silverio Decamp

## 2021-01-31 NOTE — Telephone Encounter (Signed)
Spoke to patient and she states she was diagnosed this week at [facility] with Myasthenia Graves. She wanted to know if that could have a bearing on her G.I. isssues.

## 2021-01-31 NOTE — Telephone Encounter (Signed)
Inbound call from pt requesting a call back stating she just received a diagnosis and would like to talk to you about it. Please advise. Thanks.

## 2021-02-12 ENCOUNTER — Telehealth: Payer: Self-pay | Admitting: Gastroenterology

## 2021-02-12 NOTE — Telephone Encounter (Signed)
Inbound call from pt requesting a call back stating that she has questions regarding her prep. Please advise. Thanks

## 2021-02-12 NOTE — Telephone Encounter (Signed)
Returned patients call and let her know that pharmacy was ordering SUTAB and they would have it tomorrow.

## 2021-02-27 ENCOUNTER — Other Ambulatory Visit: Payer: Self-pay

## 2021-02-27 ENCOUNTER — Encounter: Payer: Self-pay | Admitting: Gastroenterology

## 2021-02-27 ENCOUNTER — Ambulatory Visit (AMBULATORY_SURGERY_CENTER): Payer: Medicare Other | Admitting: Gastroenterology

## 2021-02-27 VITALS — BP 113/62 | HR 65 | Temp 98.7°F | Resp 18 | Ht 66.0 in | Wt 147.0 lb

## 2021-02-27 DIAGNOSIS — D125 Benign neoplasm of sigmoid colon: Secondary | ICD-10-CM | POA: Diagnosis not present

## 2021-02-27 DIAGNOSIS — R197 Diarrhea, unspecified: Secondary | ICD-10-CM | POA: Diagnosis not present

## 2021-02-27 DIAGNOSIS — K648 Other hemorrhoids: Secondary | ICD-10-CM

## 2021-02-27 DIAGNOSIS — I10 Essential (primary) hypertension: Secondary | ICD-10-CM | POA: Diagnosis not present

## 2021-02-27 DIAGNOSIS — E119 Type 2 diabetes mellitus without complications: Secondary | ICD-10-CM | POA: Diagnosis not present

## 2021-02-27 MED ORDER — SODIUM CHLORIDE 0.9 % IV SOLN: 500.0000 mL | Freq: Once | INTRAVENOUS | Status: DC

## 2021-02-27 NOTE — Patient Instructions (Signed)
Handouts given:  polyps, hemorrhoids Resume previous diet Continue current medications Await pathology results Start using benefiber -2 teaspoons by mouth three times daily with meals  YOU HAD AN ENDOSCOPIC PROCEDURE TODAY AT [facility]:   Refer to the procedure report that was given to you for any specific questions about what was found during the examination.  If the procedure report does not answer your questions, please call your gastroenterologist to clarify.  If you requested that your care partner not be given the details of your procedure findings, then the procedure report has been included in a sealed envelope for you to review at your convenience later.  YOU SHOULD EXPECT: Some feelings of bloating in the abdomen. Passage of more gas than usual.  Walking can help get rid of the air that was put into your GI tract during the procedure and reduce the bloating. If you had a lower endoscopy (such as a colonoscopy or flexible sigmoidoscopy) you Collignon notice spotting of blood in your stool or on the toilet paper. If you underwent a bowel prep for your procedure, you Morrissette not have a normal bowel movement for a few days.  Please Note:  You might notice some irritation and congestion in your nose or some drainage.  This is from the oxygen used during your procedure.  There is no need for concern and it should clear up in a day or so.  SYMPTOMS TO REPORT IMMEDIATELY:  Following lower endoscopy (colonoscopy or flexible sigmoidoscopy):  Excessive amounts of blood in the stool  Significant tenderness or worsening of abdominal pains  Swelling of the abdomen that is new, acute  Fever of 100F or higher For urgent or emergent issues, a gastroenterologist can be reached at any hour by calling [phone]. Do not use MyChart messaging for urgent concerns.   DIET:  We do recommend a small meal at first, but then you Vellucci proceed to your regular diet.  Drink plenty of fluids but you  should avoid alcoholic beverages for 24 hours.  ACTIVITY:  You should plan to take it easy for the rest of today and you should NOT DRIVE or use heavy machinery until tomorrow (because of the sedation medicines used during the test).    FOLLOW UP: Our staff will call the number listed on your records 48-72 hours following your procedure to check on you and address any questions or concerns that you Be[place]ler have regarding the information given to you following your procedure. If we do not reach you, we will leave a message.  We will attempt to reach you two times.  During this call, we will ask if you have developed any symptoms of COVID 19. If you develop any symptoms (ie: fever, flu-like symptoms, shortness of breath, cough etc.) before then, please call [phone].  If you test positive for Covid 19 in the 2 weeks post procedure, please call and report this information to [place].    If any biopsies were taken you will be contacted by phone or by letter within the next 1-3 weeks.  Please call us at [phone] if you have not heard about the biopsies in 3 weeks.   SIGNATURES/CONFIDENTIALITY: You and/or your care partner have signed paperwork which will be entered into your electronic medical record.  These signatures attest to the fact that that the information above on your After Visit Summary has been reviewed and is understood.  Full responsibility of the confidentiality of this discharge information lies with  you and/or your care-partner.

## 2021-02-27 NOTE — Progress Notes (Signed)
Report given to PACU, vss 

## 2021-02-27 NOTE — Op Note (Signed)
[facility] Patient Name: Kristy Washington Procedure Date: 02/27/2021 10:36 AM MRN: [id number] Endoscopist: Mauri Pole , MD Age: 80 Referring MD:  Date of Birth: 1941-02-27 Gender: Female Account #: [id number] Procedure:                Colonoscopy Indications:              Clinically significant diarrhea of unexplained                            origin Medicines:                Monitored Anesthesia Care Procedure:                Pre-Anesthesia Assessment:                           - Prior to the procedure, a History and Physical                            was performed, and patient medications and                            allergies were reviewed. The patient's tolerance of                            previous anesthesia was also reviewed. The risks                            and benefits of the procedure and the sedation                            options and risks were discussed with the patient.                            All questions were answered, and informed consent                            was obtained. Prior Anticoagulants: The patient has                            taken no previous anticoagulant or antiplatelet                            agents. ASA Grade Assessment: II - A patient with                            mild systemic disease. After reviewing the risks                            and benefits, the patient was deemed in                            satisfactory condition to undergo the procedure.  After obtaining informed consent, the colonoscope                            was passed under direct vision. Throughout the                            procedure, the patient's blood pressure, pulse, and                            oxygen saturations were monitored continuously. The                            Olympus PCF-H190DL (#[id number]) Colonoscope was                            introduced through the anus and advanced to the the                             cecum, identified by appendiceal orifice and                            ileocecal valve. The colonoscopy was performed                            without difficulty. The patient tolerated the                            procedure well. The quality of the bowel                            preparation was good. The ileocecal valve,                            appendiceal orifice, and rectum were photographed. Scope In: 10:54:23 AM Scope Out: 44:01:02 AM Scope Withdrawal Time: 0 hours 9 minutes 12 seconds  Total Procedure Duration: 0 hours 11 minutes 54 seconds  Findings:                 The digital rectal exam findings include decreased                            sphincter tone.                           Normal mucosa was found in the entire colon.                            Biopsies for histology were taken with a cold                            forceps from the right colon and left colon for                            evaluation of microscopic colitis.  A 3 mm polyp was found in the sigmoid colon. The                            polyp was sessile. The polyp was removed with a                            cold snare. Resection and retrieval were complete.                           Non-bleeding external and internal hemorrhoids were                            found during retroflexion. The hemorrhoids were                            medium-sized. Complications:            No immediate complications. Estimated Blood Loss:     Estimated blood loss was minimal. Impression:               - Decreased sphincter tone found on digital rectal                            exam.                           - Normal mucosa in the entire examined colon.                            Biopsied.                           - One 3 mm polyp in the sigmoid colon, removed with                            a cold snare. Resected and retrieved.                           -  Non-bleeding external and internal hemorrhoids. Recommendation:           - Patient has a contact number available for                            emergencies. The signs and symptoms of potential                            delayed complications were discussed with the                            patient. Return to normal activities tomorrow.                            Written discharge instructions were provided to the                            patient.                           -  Resume previous diet.                           - Continue present medications.                           - Await pathology results.                           - No repeat colonoscopy due to age.                           - Use Benefiber two teaspoons PO TID with meals. Mauri Pole, MD 02/27/2021 11:11:35 AM This report has been signed electronically.

## 2021-02-27 NOTE — Progress Notes (Signed)
Called to room to assist during endoscopic procedure.  Patient ID and intended procedure confirmed with present staff. Received instructions for my participation in the procedure from the performing physician.  

## 2021-02-27 NOTE — Progress Notes (Signed)
VS by Metcalfe. 

## 2021-03-01 ENCOUNTER — Telehealth: Payer: Self-pay

## 2021-03-01 NOTE — Telephone Encounter (Signed)
LVM

## 2021-03-10 DIAGNOSIS — E7800 Pure hypercholesterolemia, unspecified: Secondary | ICD-10-CM | POA: Diagnosis not present

## 2021-03-10 DIAGNOSIS — I10 Essential (primary) hypertension: Secondary | ICD-10-CM | POA: Diagnosis not present

## 2021-03-15 DIAGNOSIS — Z7952 Long term (current) use of systemic steroids: Secondary | ICD-10-CM | POA: Diagnosis not present

## 2021-03-15 DIAGNOSIS — E7489 Other specified disorders of carbohydrate metabolism: Secondary | ICD-10-CM | POA: Diagnosis not present

## 2021-03-15 DIAGNOSIS — G7000 Myasthenia gravis without (acute) exacerbation: Secondary | ICD-10-CM | POA: Diagnosis not present

## 2021-03-15 DIAGNOSIS — Z79899 Other long term (current) drug therapy: Secondary | ICD-10-CM | POA: Diagnosis not present

## 2021-03-15 DIAGNOSIS — F419 Anxiety disorder, unspecified: Secondary | ICD-10-CM | POA: Diagnosis not present

## 2021-03-18 ENCOUNTER — Encounter: Payer: Self-pay | Admitting: Gastroenterology

## 2021-03-20 DIAGNOSIS — L84 Corns and callosities: Secondary | ICD-10-CM | POA: Diagnosis not present

## 2021-03-20 DIAGNOSIS — B351 Tinea unguium: Secondary | ICD-10-CM | POA: Diagnosis not present

## 2021-03-20 DIAGNOSIS — M79676 Pain in unspecified toe(s): Secondary | ICD-10-CM | POA: Diagnosis not present

## 2021-03-20 DIAGNOSIS — E1142 Type 2 diabetes mellitus with diabetic polyneuropathy: Secondary | ICD-10-CM | POA: Diagnosis not present

## 2021-03-21 DIAGNOSIS — G7000 Myasthenia gravis without (acute) exacerbation: Secondary | ICD-10-CM | POA: Diagnosis not present

## 2021-03-21 DIAGNOSIS — H532 Diplopia: Secondary | ICD-10-CM | POA: Diagnosis not present

## 2021-03-21 DIAGNOSIS — H5022 Vertical strabismus, left eye: Secondary | ICD-10-CM | POA: Diagnosis not present

## 2021-04-02 DIAGNOSIS — G7000 Myasthenia gravis without (acute) exacerbation: Secondary | ICD-10-CM | POA: Diagnosis not present

## 2021-04-16 DIAGNOSIS — I4891 Unspecified atrial fibrillation: Secondary | ICD-10-CM | POA: Diagnosis not present

## 2021-04-16 DIAGNOSIS — Z299 Encounter for prophylactic measures, unspecified: Secondary | ICD-10-CM | POA: Diagnosis not present

## 2021-04-16 DIAGNOSIS — I10 Essential (primary) hypertension: Secondary | ICD-10-CM | POA: Diagnosis not present

## 2021-04-16 DIAGNOSIS — E1165 Type 2 diabetes mellitus with hyperglycemia: Secondary | ICD-10-CM | POA: Diagnosis not present

## 2021-04-16 DIAGNOSIS — I700 Atherosclerosis of aorta: Secondary | ICD-10-CM | POA: Diagnosis not present

## 2021-04-16 DIAGNOSIS — K529 Noninfective gastroenteritis and colitis, unspecified: Secondary | ICD-10-CM | POA: Diagnosis not present

## 2021-04-30 DIAGNOSIS — Z23 Encounter for immunization: Secondary | ICD-10-CM | POA: Diagnosis not present

## 2021-05-14 DIAGNOSIS — Z23 Encounter for immunization: Secondary | ICD-10-CM | POA: Diagnosis not present

## 2021-05-29 DIAGNOSIS — B351 Tinea unguium: Secondary | ICD-10-CM | POA: Diagnosis not present

## 2021-05-29 DIAGNOSIS — M79676 Pain in unspecified toe(s): Secondary | ICD-10-CM | POA: Diagnosis not present

## 2021-05-29 DIAGNOSIS — L84 Corns and callosities: Secondary | ICD-10-CM | POA: Diagnosis not present

## 2021-05-29 DIAGNOSIS — E1142 Type 2 diabetes mellitus with diabetic polyneuropathy: Secondary | ICD-10-CM | POA: Diagnosis not present

## 2021-06-12 DIAGNOSIS — Z45010 Encounter for checking and testing of cardiac pacemaker pulse generator [battery]: Secondary | ICD-10-CM | POA: Diagnosis not present

## 2021-06-13 DIAGNOSIS — Z872 Personal history of diseases of the skin and subcutaneous tissue: Secondary | ICD-10-CM | POA: Diagnosis not present

## 2021-06-13 DIAGNOSIS — L814 Other melanin hyperpigmentation: Secondary | ICD-10-CM | POA: Diagnosis not present

## 2021-06-13 DIAGNOSIS — Z85828 Personal history of other malignant neoplasm of skin: Secondary | ICD-10-CM | POA: Diagnosis not present

## 2021-06-13 DIAGNOSIS — D2239 Melanocytic nevi of other parts of face: Secondary | ICD-10-CM | POA: Diagnosis not present

## 2021-06-13 DIAGNOSIS — L80 Vitiligo: Secondary | ICD-10-CM | POA: Diagnosis not present

## 2021-06-13 DIAGNOSIS — L304 Erythema intertrigo: Secondary | ICD-10-CM | POA: Diagnosis not present

## 2021-06-13 DIAGNOSIS — Z08 Encounter for follow-up examination after completed treatment for malignant neoplasm: Secondary | ICD-10-CM | POA: Diagnosis not present

## 2021-06-13 DIAGNOSIS — L821 Other seborrheic keratosis: Secondary | ICD-10-CM | POA: Diagnosis not present

## 2021-06-18 DIAGNOSIS — R197 Diarrhea, unspecified: Secondary | ICD-10-CM | POA: Diagnosis not present

## 2021-06-18 DIAGNOSIS — Z888 Allergy status to other drugs, medicaments and biological substances status: Secondary | ICD-10-CM | POA: Diagnosis not present

## 2021-06-18 DIAGNOSIS — R7989 Other specified abnormal findings of blood chemistry: Secondary | ICD-10-CM | POA: Diagnosis not present

## 2021-06-18 DIAGNOSIS — H02402 Unspecified ptosis of left eyelid: Secondary | ICD-10-CM | POA: Diagnosis not present

## 2021-06-18 DIAGNOSIS — G7000 Myasthenia gravis without (acute) exacerbation: Secondary | ICD-10-CM | POA: Diagnosis not present

## 2021-06-18 DIAGNOSIS — H532 Diplopia: Secondary | ICD-10-CM | POA: Diagnosis not present

## 2021-06-19 DIAGNOSIS — H532 Diplopia: Secondary | ICD-10-CM | POA: Diagnosis not present

## 2021-06-19 DIAGNOSIS — R197 Diarrhea, unspecified: Secondary | ICD-10-CM | POA: Diagnosis not present

## 2021-06-19 DIAGNOSIS — G7000 Myasthenia gravis without (acute) exacerbation: Secondary | ICD-10-CM | POA: Diagnosis not present

## 2021-06-19 DIAGNOSIS — E1143 Type 2 diabetes mellitus with diabetic autonomic (poly)neuropathy: Secondary | ICD-10-CM | POA: Diagnosis not present

## 2021-06-24 DIAGNOSIS — R197 Diarrhea, unspecified: Secondary | ICD-10-CM | POA: Diagnosis not present

## 2021-06-24 DIAGNOSIS — E1143 Type 2 diabetes mellitus with diabetic autonomic (poly)neuropathy: Secondary | ICD-10-CM | POA: Diagnosis not present

## 2021-07-10 DIAGNOSIS — E7800 Pure hypercholesterolemia, unspecified: Secondary | ICD-10-CM | POA: Diagnosis not present

## 2021-07-10 DIAGNOSIS — I4891 Unspecified atrial fibrillation: Secondary | ICD-10-CM | POA: Diagnosis not present

## 2021-07-10 DIAGNOSIS — E785 Hyperlipidemia, unspecified: Secondary | ICD-10-CM | POA: Diagnosis not present

## 2021-07-10 DIAGNOSIS — Z950 Presence of cardiac pacemaker: Secondary | ICD-10-CM | POA: Diagnosis not present

## 2021-07-10 DIAGNOSIS — I272 Pulmonary hypertension, unspecified: Secondary | ICD-10-CM | POA: Diagnosis not present

## 2021-07-10 DIAGNOSIS — R079 Chest pain, unspecified: Secondary | ICD-10-CM | POA: Diagnosis not present

## 2021-07-10 DIAGNOSIS — I10 Essential (primary) hypertension: Secondary | ICD-10-CM | POA: Diagnosis not present

## 2021-07-15 DIAGNOSIS — R070 Pain in throat: Secondary | ICD-10-CM | POA: Diagnosis not present

## 2021-07-15 DIAGNOSIS — J018 Other acute sinusitis: Secondary | ICD-10-CM | POA: Diagnosis not present

## 2021-07-15 DIAGNOSIS — R051 Acute cough: Secondary | ICD-10-CM | POA: Diagnosis not present

## 2021-08-02 DIAGNOSIS — I272 Pulmonary hypertension, unspecified: Secondary | ICD-10-CM | POA: Diagnosis not present

## 2021-08-02 DIAGNOSIS — Z950 Presence of cardiac pacemaker: Secondary | ICD-10-CM | POA: Diagnosis not present

## 2021-08-02 DIAGNOSIS — R0789 Other chest pain: Secondary | ICD-10-CM | POA: Diagnosis not present

## 2021-08-02 DIAGNOSIS — I4891 Unspecified atrial fibrillation: Secondary | ICD-10-CM | POA: Diagnosis not present

## 2021-08-07 DIAGNOSIS — I499 Cardiac arrhythmia, unspecified: Secondary | ICD-10-CM | POA: Diagnosis not present

## 2021-08-07 DIAGNOSIS — R0789 Other chest pain: Secondary | ICD-10-CM | POA: Diagnosis not present

## 2021-08-09 DIAGNOSIS — I10 Essential (primary) hypertension: Secondary | ICD-10-CM | POA: Diagnosis not present

## 2021-08-14 DIAGNOSIS — M79676 Pain in unspecified toe(s): Secondary | ICD-10-CM | POA: Diagnosis not present

## 2021-08-14 DIAGNOSIS — L84 Corns and callosities: Secondary | ICD-10-CM | POA: Diagnosis not present

## 2021-08-14 DIAGNOSIS — B351 Tinea unguium: Secondary | ICD-10-CM | POA: Diagnosis not present

## 2021-08-14 DIAGNOSIS — E1142 Type 2 diabetes mellitus with diabetic polyneuropathy: Secondary | ICD-10-CM | POA: Diagnosis not present

## 2021-08-19 DIAGNOSIS — K6289 Other specified diseases of anus and rectum: Secondary | ICD-10-CM | POA: Diagnosis not present

## 2021-08-19 DIAGNOSIS — R159 Full incontinence of feces: Secondary | ICD-10-CM | POA: Diagnosis not present

## 2021-08-20 DIAGNOSIS — E049 Nontoxic goiter, unspecified: Secondary | ICD-10-CM | POA: Diagnosis not present

## 2021-08-20 DIAGNOSIS — H02402 Unspecified ptosis of left eyelid: Secondary | ICD-10-CM | POA: Diagnosis not present

## 2021-08-20 DIAGNOSIS — H532 Diplopia: Secondary | ICD-10-CM | POA: Diagnosis not present

## 2021-08-20 DIAGNOSIS — G7000 Myasthenia gravis without (acute) exacerbation: Secondary | ICD-10-CM | POA: Diagnosis not present

## 2021-08-20 DIAGNOSIS — Z7969 Long term (current) use of other immunomodulators and immunosuppressants: Secondary | ICD-10-CM | POA: Diagnosis not present

## 2021-08-20 DIAGNOSIS — K529 Noninfective gastroenteritis and colitis, unspecified: Secondary | ICD-10-CM | POA: Diagnosis not present

## 2021-08-20 DIAGNOSIS — Z7952 Long term (current) use of systemic steroids: Secondary | ICD-10-CM | POA: Diagnosis not present

## 2021-08-21 DIAGNOSIS — G7000 Myasthenia gravis without (acute) exacerbation: Secondary | ICD-10-CM | POA: Diagnosis not present

## 2021-08-21 DIAGNOSIS — I10 Essential (primary) hypertension: Secondary | ICD-10-CM | POA: Diagnosis not present

## 2021-08-21 DIAGNOSIS — Z6822 Body mass index (BMI) 22.0-22.9, adult: Secondary | ICD-10-CM | POA: Diagnosis not present

## 2021-08-21 DIAGNOSIS — D849 Immunodeficiency, unspecified: Secondary | ICD-10-CM | POA: Diagnosis not present

## 2021-08-21 DIAGNOSIS — Z299 Encounter for prophylactic measures, unspecified: Secondary | ICD-10-CM | POA: Diagnosis not present

## 2021-08-21 DIAGNOSIS — I700 Atherosclerosis of aorta: Secondary | ICD-10-CM | POA: Diagnosis not present

## 2021-08-21 DIAGNOSIS — E1165 Type 2 diabetes mellitus with hyperglycemia: Secondary | ICD-10-CM | POA: Diagnosis not present

## 2021-08-21 DIAGNOSIS — Z789 Other specified health status: Secondary | ICD-10-CM | POA: Diagnosis not present

## 2021-08-23 DIAGNOSIS — I480 Paroxysmal atrial fibrillation: Secondary | ICD-10-CM | POA: Diagnosis not present

## 2021-08-23 DIAGNOSIS — Z45018 Encounter for adjustment and management of other part of cardiac pacemaker: Secondary | ICD-10-CM | POA: Diagnosis not present

## 2021-09-02 DIAGNOSIS — H524 Presbyopia: Secondary | ICD-10-CM | POA: Diagnosis not present

## 2021-09-02 DIAGNOSIS — E119 Type 2 diabetes mellitus without complications: Secondary | ICD-10-CM | POA: Diagnosis not present

## 2021-09-08 DIAGNOSIS — R000 Tachycardia, unspecified: Secondary | ICD-10-CM | POA: Diagnosis not present

## 2021-09-08 DIAGNOSIS — E119 Type 2 diabetes mellitus without complications: Secondary | ICD-10-CM | POA: Diagnosis not present

## 2021-09-08 DIAGNOSIS — G7000 Myasthenia gravis without (acute) exacerbation: Secondary | ICD-10-CM | POA: Diagnosis not present

## 2021-09-08 DIAGNOSIS — W19XXXA Unspecified fall, initial encounter: Secondary | ICD-10-CM | POA: Diagnosis not present

## 2021-09-08 DIAGNOSIS — I10 Essential (primary) hypertension: Secondary | ICD-10-CM | POA: Diagnosis not present

## 2021-09-08 DIAGNOSIS — S72041A Displaced fracture of base of neck of right femur, initial encounter for closed fracture: Secondary | ICD-10-CM | POA: Diagnosis not present

## 2021-09-08 DIAGNOSIS — S72001A Fracture of unspecified part of neck of right femur, initial encounter for closed fracture: Secondary | ICD-10-CM | POA: Diagnosis not present

## 2021-09-08 DIAGNOSIS — S299XXA Unspecified injury of thorax, initial encounter: Secondary | ICD-10-CM | POA: Diagnosis not present

## 2021-09-08 DIAGNOSIS — I480 Paroxysmal atrial fibrillation: Secondary | ICD-10-CM | POA: Diagnosis not present

## 2021-09-09 DIAGNOSIS — S72001A Fracture of unspecified part of neck of right femur, initial encounter for closed fracture: Secondary | ICD-10-CM | POA: Diagnosis not present

## 2021-09-09 DIAGNOSIS — G7000 Myasthenia gravis without (acute) exacerbation: Secondary | ICD-10-CM | POA: Diagnosis not present

## 2021-09-09 DIAGNOSIS — E119 Type 2 diabetes mellitus without complications: Secondary | ICD-10-CM | POA: Diagnosis not present

## 2021-09-09 DIAGNOSIS — I480 Paroxysmal atrial fibrillation: Secondary | ICD-10-CM | POA: Diagnosis not present

## 2021-09-09 DIAGNOSIS — W010XXA Fall on same level from slipping, tripping and stumbling without subsequent striking against object, initial encounter: Secondary | ICD-10-CM | POA: Diagnosis not present

## 2021-09-10 DIAGNOSIS — E119 Type 2 diabetes mellitus without complications: Secondary | ICD-10-CM | POA: Diagnosis not present

## 2021-09-10 DIAGNOSIS — S72001A Fracture of unspecified part of neck of right femur, initial encounter for closed fracture: Secondary | ICD-10-CM | POA: Diagnosis not present

## 2021-09-10 DIAGNOSIS — G7000 Myasthenia gravis without (acute) exacerbation: Secondary | ICD-10-CM | POA: Diagnosis not present

## 2021-09-10 DIAGNOSIS — I480 Paroxysmal atrial fibrillation: Secondary | ICD-10-CM | POA: Diagnosis not present

## 2021-09-10 DIAGNOSIS — W010XXA Fall on same level from slipping, tripping and stumbling without subsequent striking against object, initial encounter: Secondary | ICD-10-CM | POA: Diagnosis not present

## 2021-09-10 DIAGNOSIS — I10 Essential (primary) hypertension: Secondary | ICD-10-CM | POA: Diagnosis not present

## 2021-09-10 DIAGNOSIS — I4891 Unspecified atrial fibrillation: Secondary | ICD-10-CM | POA: Diagnosis not present

## 2021-09-11 DIAGNOSIS — B9620 Unspecified Escherichia coli [E. coli] as the cause of diseases classified elsewhere: Secondary | ICD-10-CM | POA: Diagnosis not present

## 2021-09-11 DIAGNOSIS — I482 Chronic atrial fibrillation, unspecified: Secondary | ICD-10-CM | POA: Diagnosis not present

## 2021-09-11 DIAGNOSIS — M6281 Muscle weakness (generalized): Secondary | ICD-10-CM | POA: Diagnosis not present

## 2021-09-11 DIAGNOSIS — R0600 Dyspnea, unspecified: Secondary | ICD-10-CM | POA: Diagnosis not present

## 2021-09-11 DIAGNOSIS — Z20822 Contact with and (suspected) exposure to covid-19: Secondary | ICD-10-CM | POA: Diagnosis not present

## 2021-09-11 DIAGNOSIS — T8149XA Infection following a procedure, other surgical site, initial encounter: Secondary | ICD-10-CM | POA: Diagnosis not present

## 2021-09-11 DIAGNOSIS — E440 Moderate protein-calorie malnutrition: Secondary | ICD-10-CM | POA: Diagnosis not present

## 2021-09-11 DIAGNOSIS — I10 Essential (primary) hypertension: Secondary | ICD-10-CM | POA: Diagnosis not present

## 2021-09-11 DIAGNOSIS — J101 Influenza due to other identified influenza virus with other respiratory manifestations: Secondary | ICD-10-CM | POA: Diagnosis not present

## 2021-09-11 DIAGNOSIS — R739 Hyperglycemia, unspecified: Secondary | ICD-10-CM | POA: Diagnosis not present

## 2021-09-11 DIAGNOSIS — Z7984 Long term (current) use of oral hypoglycemic drugs: Secondary | ICD-10-CM | POA: Diagnosis not present

## 2021-09-11 DIAGNOSIS — B965 Pseudomonas (aeruginosa) (mallei) (pseudomallei) as the cause of diseases classified elsewhere: Secondary | ICD-10-CM | POA: Diagnosis not present

## 2021-09-11 DIAGNOSIS — J9600 Acute respiratory failure, unspecified whether with hypoxia or hypercapnia: Secondary | ICD-10-CM | POA: Diagnosis not present

## 2021-09-11 DIAGNOSIS — Z888 Allergy status to other drugs, medicaments and biological substances status: Secondary | ICD-10-CM | POA: Diagnosis not present

## 2021-09-11 DIAGNOSIS — K219 Gastro-esophageal reflux disease without esophagitis: Secondary | ICD-10-CM | POA: Diagnosis not present

## 2021-09-11 DIAGNOSIS — R0602 Shortness of breath: Secondary | ICD-10-CM | POA: Diagnosis not present

## 2021-09-11 DIAGNOSIS — E119 Type 2 diabetes mellitus without complications: Secondary | ICD-10-CM | POA: Diagnosis not present

## 2021-09-11 DIAGNOSIS — I509 Heart failure, unspecified: Secondary | ICD-10-CM | POA: Diagnosis not present

## 2021-09-11 DIAGNOSIS — M25551 Pain in right hip: Secondary | ICD-10-CM | POA: Diagnosis not present

## 2021-09-11 DIAGNOSIS — Z471 Aftercare following joint replacement surgery: Secondary | ICD-10-CM | POA: Diagnosis not present

## 2021-09-11 DIAGNOSIS — E785 Hyperlipidemia, unspecified: Secondary | ICD-10-CM | POA: Diagnosis not present

## 2021-09-11 DIAGNOSIS — Z743 Need for continuous supervision: Secondary | ICD-10-CM | POA: Diagnosis not present

## 2021-09-11 DIAGNOSIS — I480 Paroxysmal atrial fibrillation: Secondary | ICD-10-CM | POA: Diagnosis not present

## 2021-09-11 DIAGNOSIS — I959 Hypotension, unspecified: Secondary | ICD-10-CM | POA: Diagnosis not present

## 2021-09-11 DIAGNOSIS — Z5189 Encounter for other specified aftercare: Secondary | ICD-10-CM | POA: Diagnosis not present

## 2021-09-11 DIAGNOSIS — Z950 Presence of cardiac pacemaker: Secondary | ICD-10-CM | POA: Diagnosis not present

## 2021-09-11 DIAGNOSIS — E871 Hypo-osmolality and hyponatremia: Secondary | ICD-10-CM | POA: Diagnosis not present

## 2021-09-11 DIAGNOSIS — S72001D Fracture of unspecified part of neck of right femur, subsequent encounter for closed fracture with routine healing: Secondary | ICD-10-CM | POA: Diagnosis not present

## 2021-09-11 DIAGNOSIS — R059 Cough, unspecified: Secondary | ICD-10-CM | POA: Diagnosis not present

## 2021-09-11 DIAGNOSIS — N390 Urinary tract infection, site not specified: Secondary | ICD-10-CM | POA: Diagnosis not present

## 2021-09-11 DIAGNOSIS — Z7969 Long term (current) use of other immunomodulators and immunosuppressants: Secondary | ICD-10-CM | POA: Diagnosis not present

## 2021-09-11 DIAGNOSIS — R52 Pain, unspecified: Secondary | ICD-10-CM | POA: Diagnosis not present

## 2021-09-11 DIAGNOSIS — Z1629 Resistance to other single specified antibiotic: Secondary | ICD-10-CM | POA: Diagnosis not present

## 2021-09-11 DIAGNOSIS — Z7901 Long term (current) use of anticoagulants: Secondary | ICD-10-CM | POA: Diagnosis not present

## 2021-09-11 DIAGNOSIS — R4182 Altered mental status, unspecified: Secondary | ICD-10-CM | POA: Diagnosis not present

## 2021-09-11 DIAGNOSIS — M8580 Other specified disorders of bone density and structure, unspecified site: Secondary | ICD-10-CM | POA: Diagnosis not present

## 2021-09-11 DIAGNOSIS — L03115 Cellulitis of right lower limb: Secondary | ICD-10-CM | POA: Diagnosis not present

## 2021-09-11 DIAGNOSIS — R609 Edema, unspecified: Secondary | ICD-10-CM | POA: Diagnosis not present

## 2021-09-11 DIAGNOSIS — E876 Hypokalemia: Secondary | ICD-10-CM | POA: Diagnosis not present

## 2021-09-11 DIAGNOSIS — G709 Myoneural disorder, unspecified: Secondary | ICD-10-CM | POA: Diagnosis not present

## 2021-09-11 DIAGNOSIS — R238 Other skin changes: Secondary | ICD-10-CM | POA: Diagnosis not present

## 2021-09-11 DIAGNOSIS — R197 Diarrhea, unspecified: Secondary | ICD-10-CM | POA: Diagnosis not present

## 2021-09-11 DIAGNOSIS — G7000 Myasthenia gravis without (acute) exacerbation: Secondary | ICD-10-CM | POA: Diagnosis not present

## 2021-09-11 DIAGNOSIS — T8141XA Infection following a procedure, superficial incisional surgical site, initial encounter: Secondary | ICD-10-CM | POA: Diagnosis not present

## 2021-09-11 DIAGNOSIS — Z7952 Long term (current) use of systemic steroids: Secondary | ICD-10-CM | POA: Diagnosis not present

## 2021-09-11 DIAGNOSIS — Z79899 Other long term (current) drug therapy: Secondary | ICD-10-CM | POA: Diagnosis not present

## 2021-09-11 DIAGNOSIS — S72001A Fracture of unspecified part of neck of right femur, initial encounter for closed fracture: Secondary | ICD-10-CM | POA: Diagnosis not present

## 2021-09-11 DIAGNOSIS — K529 Noninfective gastroenteritis and colitis, unspecified: Secondary | ICD-10-CM | POA: Diagnosis not present

## 2021-09-11 DIAGNOSIS — R069 Unspecified abnormalities of breathing: Secondary | ICD-10-CM | POA: Diagnosis not present

## 2021-09-11 DIAGNOSIS — M810 Age-related osteoporosis without current pathological fracture: Secondary | ICD-10-CM | POA: Diagnosis not present

## 2021-09-11 DIAGNOSIS — Z1159 Encounter for screening for other viral diseases: Secondary | ICD-10-CM | POA: Diagnosis not present

## 2021-09-12 DIAGNOSIS — I10 Essential (primary) hypertension: Secondary | ICD-10-CM | POA: Diagnosis not present

## 2021-09-12 DIAGNOSIS — R238 Other skin changes: Secondary | ICD-10-CM | POA: Diagnosis not present

## 2021-09-12 DIAGNOSIS — S72001D Fracture of unspecified part of neck of right femur, subsequent encounter for closed fracture with routine healing: Secondary | ICD-10-CM | POA: Diagnosis not present

## 2021-09-12 DIAGNOSIS — M6281 Muscle weakness (generalized): Secondary | ICD-10-CM | POA: Diagnosis not present

## 2021-09-12 DIAGNOSIS — Z471 Aftercare following joint replacement surgery: Secondary | ICD-10-CM | POA: Diagnosis not present

## 2021-09-12 DIAGNOSIS — I482 Chronic atrial fibrillation, unspecified: Secondary | ICD-10-CM | POA: Diagnosis not present

## 2021-09-12 DIAGNOSIS — G7000 Myasthenia gravis without (acute) exacerbation: Secondary | ICD-10-CM | POA: Diagnosis not present

## 2021-09-16 DIAGNOSIS — I10 Essential (primary) hypertension: Secondary | ICD-10-CM | POA: Diagnosis not present

## 2021-09-16 DIAGNOSIS — S72001D Fracture of unspecified part of neck of right femur, subsequent encounter for closed fracture with routine healing: Secondary | ICD-10-CM | POA: Diagnosis not present

## 2021-09-16 DIAGNOSIS — M6281 Muscle weakness (generalized): Secondary | ICD-10-CM | POA: Diagnosis not present

## 2021-09-16 DIAGNOSIS — Z471 Aftercare following joint replacement surgery: Secondary | ICD-10-CM | POA: Diagnosis not present

## 2021-09-19 DIAGNOSIS — R238 Other skin changes: Secondary | ICD-10-CM | POA: Diagnosis not present

## 2021-09-19 DIAGNOSIS — R52 Pain, unspecified: Secondary | ICD-10-CM | POA: Diagnosis not present

## 2021-09-19 DIAGNOSIS — Z5189 Encounter for other specified aftercare: Secondary | ICD-10-CM | POA: Diagnosis not present

## 2021-09-19 DIAGNOSIS — Z471 Aftercare following joint replacement surgery: Secondary | ICD-10-CM | POA: Diagnosis not present

## 2021-09-19 DIAGNOSIS — M6281 Muscle weakness (generalized): Secondary | ICD-10-CM | POA: Diagnosis not present

## 2021-09-19 DIAGNOSIS — G7000 Myasthenia gravis without (acute) exacerbation: Secondary | ICD-10-CM | POA: Diagnosis not present

## 2021-09-19 DIAGNOSIS — S72001D Fracture of unspecified part of neck of right femur, subsequent encounter for closed fracture with routine healing: Secondary | ICD-10-CM | POA: Diagnosis not present

## 2021-09-20 DIAGNOSIS — R609 Edema, unspecified: Secondary | ICD-10-CM | POA: Diagnosis not present

## 2021-09-20 DIAGNOSIS — E119 Type 2 diabetes mellitus without complications: Secondary | ICD-10-CM | POA: Diagnosis not present

## 2021-09-24 DIAGNOSIS — S72001D Fracture of unspecified part of neck of right femur, subsequent encounter for closed fracture with routine healing: Secondary | ICD-10-CM | POA: Diagnosis not present

## 2021-09-24 DIAGNOSIS — R52 Pain, unspecified: Secondary | ICD-10-CM | POA: Diagnosis not present

## 2021-09-24 DIAGNOSIS — Z471 Aftercare following joint replacement surgery: Secondary | ICD-10-CM | POA: Diagnosis not present

## 2021-09-24 DIAGNOSIS — M6281 Muscle weakness (generalized): Secondary | ICD-10-CM | POA: Diagnosis not present

## 2021-09-25 DIAGNOSIS — R609 Edema, unspecified: Secondary | ICD-10-CM | POA: Diagnosis not present

## 2021-09-25 DIAGNOSIS — S72001D Fracture of unspecified part of neck of right femur, subsequent encounter for closed fracture with routine healing: Secondary | ICD-10-CM | POA: Diagnosis not present

## 2021-09-25 DIAGNOSIS — Z471 Aftercare following joint replacement surgery: Secondary | ICD-10-CM | POA: Diagnosis not present

## 2021-09-25 DIAGNOSIS — M6281 Muscle weakness (generalized): Secondary | ICD-10-CM | POA: Diagnosis not present

## 2021-09-26 DIAGNOSIS — Z5189 Encounter for other specified aftercare: Secondary | ICD-10-CM | POA: Diagnosis not present

## 2021-09-26 DIAGNOSIS — G7000 Myasthenia gravis without (acute) exacerbation: Secondary | ICD-10-CM | POA: Diagnosis not present

## 2021-09-26 DIAGNOSIS — R238 Other skin changes: Secondary | ICD-10-CM | POA: Diagnosis not present

## 2021-09-26 DIAGNOSIS — M6281 Muscle weakness (generalized): Secondary | ICD-10-CM | POA: Diagnosis not present

## 2021-09-29 DIAGNOSIS — J09X2 Influenza due to identified novel influenza A virus with other respiratory manifestations: Secondary | ICD-10-CM | POA: Diagnosis not present

## 2021-09-29 DIAGNOSIS — J101 Influenza due to other identified influenza virus with other respiratory manifestations: Secondary | ICD-10-CM | POA: Diagnosis not present

## 2021-09-29 DIAGNOSIS — M25551 Pain in right hip: Secondary | ICD-10-CM | POA: Diagnosis not present

## 2021-09-29 DIAGNOSIS — Z1159 Encounter for screening for other viral diseases: Secondary | ICD-10-CM | POA: Diagnosis not present

## 2021-09-29 DIAGNOSIS — E785 Hyperlipidemia, unspecified: Secondary | ICD-10-CM | POA: Diagnosis not present

## 2021-09-29 DIAGNOSIS — J9600 Acute respiratory failure, unspecified whether with hypoxia or hypercapnia: Secondary | ICD-10-CM | POA: Diagnosis not present

## 2021-09-29 DIAGNOSIS — Z1629 Resistance to other single specified antibiotic: Secondary | ICD-10-CM | POA: Diagnosis not present

## 2021-09-29 DIAGNOSIS — R069 Unspecified abnormalities of breathing: Secondary | ICD-10-CM | POA: Diagnosis not present

## 2021-09-29 DIAGNOSIS — R4182 Altered mental status, unspecified: Secondary | ICD-10-CM | POA: Diagnosis not present

## 2021-09-29 DIAGNOSIS — Z7952 Long term (current) use of systemic steroids: Secondary | ICD-10-CM | POA: Diagnosis not present

## 2021-09-29 DIAGNOSIS — Z471 Aftercare following joint replacement surgery: Secondary | ICD-10-CM | POA: Diagnosis not present

## 2021-09-29 DIAGNOSIS — D84821 Immunodeficiency due to drugs: Secondary | ICD-10-CM | POA: Diagnosis not present

## 2021-09-29 DIAGNOSIS — K529 Noninfective gastroenteritis and colitis, unspecified: Secondary | ICD-10-CM | POA: Diagnosis not present

## 2021-09-29 DIAGNOSIS — Z20822 Contact with and (suspected) exposure to covid-19: Secondary | ICD-10-CM | POA: Diagnosis not present

## 2021-09-29 DIAGNOSIS — R262 Difficulty in walking, not elsewhere classified: Secondary | ICD-10-CM | POA: Diagnosis not present

## 2021-09-29 DIAGNOSIS — R52 Pain, unspecified: Secondary | ICD-10-CM | POA: Diagnosis not present

## 2021-09-29 DIAGNOSIS — E119 Type 2 diabetes mellitus without complications: Secondary | ICD-10-CM | POA: Diagnosis not present

## 2021-09-29 DIAGNOSIS — Z79899 Other long term (current) drug therapy: Secondary | ICD-10-CM | POA: Diagnosis not present

## 2021-09-29 DIAGNOSIS — M810 Age-related osteoporosis without current pathological fracture: Secondary | ICD-10-CM | POA: Diagnosis not present

## 2021-09-29 DIAGNOSIS — S72001D Fracture of unspecified part of neck of right femur, subsequent encounter for closed fracture with routine healing: Secondary | ICD-10-CM | POA: Diagnosis not present

## 2021-09-29 DIAGNOSIS — Z7984 Long term (current) use of oral hypoglycemic drugs: Secondary | ICD-10-CM | POA: Diagnosis not present

## 2021-09-29 DIAGNOSIS — I509 Heart failure, unspecified: Secondary | ICD-10-CM | POA: Diagnosis not present

## 2021-09-29 DIAGNOSIS — T8149XA Infection following a procedure, other surgical site, initial encounter: Secondary | ICD-10-CM | POA: Diagnosis not present

## 2021-09-29 DIAGNOSIS — G7000 Myasthenia gravis without (acute) exacerbation: Secondary | ICD-10-CM | POA: Diagnosis not present

## 2021-09-29 DIAGNOSIS — K219 Gastro-esophageal reflux disease without esophagitis: Secondary | ICD-10-CM | POA: Diagnosis not present

## 2021-09-29 DIAGNOSIS — I959 Hypotension, unspecified: Secondary | ICD-10-CM | POA: Diagnosis not present

## 2021-09-29 DIAGNOSIS — I10 Essential (primary) hypertension: Secondary | ICD-10-CM | POA: Diagnosis not present

## 2021-09-29 DIAGNOSIS — G709 Myoneural disorder, unspecified: Secondary | ICD-10-CM | POA: Diagnosis not present

## 2021-09-29 DIAGNOSIS — T8141XA Infection following a procedure, superficial incisional surgical site, initial encounter: Secondary | ICD-10-CM | POA: Diagnosis not present

## 2021-09-29 DIAGNOSIS — B9620 Unspecified Escherichia coli [E. coli] as the cause of diseases classified elsewhere: Secondary | ICD-10-CM | POA: Diagnosis not present

## 2021-09-29 DIAGNOSIS — R739 Hyperglycemia, unspecified: Secondary | ICD-10-CM | POA: Diagnosis not present

## 2021-09-29 DIAGNOSIS — E440 Moderate protein-calorie malnutrition: Secondary | ICD-10-CM | POA: Diagnosis not present

## 2021-09-29 DIAGNOSIS — M8580 Other specified disorders of bone density and structure, unspecified site: Secondary | ICD-10-CM | POA: Diagnosis not present

## 2021-09-29 DIAGNOSIS — E876 Hypokalemia: Secondary | ICD-10-CM | POA: Diagnosis not present

## 2021-09-29 DIAGNOSIS — R197 Diarrhea, unspecified: Secondary | ICD-10-CM | POA: Diagnosis not present

## 2021-09-29 DIAGNOSIS — Z888 Allergy status to other drugs, medicaments and biological substances status: Secondary | ICD-10-CM | POA: Diagnosis not present

## 2021-09-29 DIAGNOSIS — Z7969 Long term (current) use of other immunomodulators and immunosuppressants: Secondary | ICD-10-CM | POA: Diagnosis not present

## 2021-09-29 DIAGNOSIS — N390 Urinary tract infection, site not specified: Secondary | ICD-10-CM | POA: Diagnosis not present

## 2021-09-29 DIAGNOSIS — R0600 Dyspnea, unspecified: Secondary | ICD-10-CM | POA: Diagnosis not present

## 2021-09-29 DIAGNOSIS — B965 Pseudomonas (aeruginosa) (mallei) (pseudomallei) as the cause of diseases classified elsewhere: Secondary | ICD-10-CM | POA: Diagnosis not present

## 2021-09-29 DIAGNOSIS — M6281 Muscle weakness (generalized): Secondary | ICD-10-CM | POA: Diagnosis not present

## 2021-09-29 DIAGNOSIS — B9629 Other Escherichia coli [E. coli] as the cause of diseases classified elsewhere: Secondary | ICD-10-CM | POA: Diagnosis not present

## 2021-09-29 DIAGNOSIS — I482 Chronic atrial fibrillation, unspecified: Secondary | ICD-10-CM | POA: Diagnosis not present

## 2021-09-29 DIAGNOSIS — Z7901 Long term (current) use of anticoagulants: Secondary | ICD-10-CM | POA: Diagnosis not present

## 2021-09-29 DIAGNOSIS — R0602 Shortness of breath: Secondary | ICD-10-CM | POA: Diagnosis not present

## 2021-09-29 DIAGNOSIS — I480 Paroxysmal atrial fibrillation: Secondary | ICD-10-CM | POA: Diagnosis not present

## 2021-09-29 DIAGNOSIS — Z950 Presence of cardiac pacemaker: Secondary | ICD-10-CM | POA: Diagnosis not present

## 2021-09-29 DIAGNOSIS — L03115 Cellulitis of right lower limb: Secondary | ICD-10-CM | POA: Diagnosis not present

## 2021-09-29 DIAGNOSIS — R293 Abnormal posture: Secondary | ICD-10-CM | POA: Diagnosis not present

## 2021-09-29 DIAGNOSIS — E871 Hypo-osmolality and hyponatremia: Secondary | ICD-10-CM | POA: Diagnosis not present

## 2021-10-06 DIAGNOSIS — I482 Chronic atrial fibrillation, unspecified: Secondary | ICD-10-CM | POA: Diagnosis not present

## 2021-10-06 DIAGNOSIS — R300 Dysuria: Secondary | ICD-10-CM | POA: Diagnosis not present

## 2021-10-06 DIAGNOSIS — R10819 Abdominal tenderness, unspecified site: Secondary | ICD-10-CM | POA: Diagnosis not present

## 2021-10-06 DIAGNOSIS — L89893 Pressure ulcer of other site, stage 3: Secondary | ICD-10-CM | POA: Diagnosis not present

## 2021-10-06 DIAGNOSIS — I509 Heart failure, unspecified: Secondary | ICD-10-CM | POA: Diagnosis not present

## 2021-10-06 DIAGNOSIS — R609 Edema, unspecified: Secondary | ICD-10-CM | POA: Diagnosis not present

## 2021-10-06 DIAGNOSIS — I951 Orthostatic hypotension: Secondary | ICD-10-CM | POA: Diagnosis not present

## 2021-10-06 DIAGNOSIS — Z471 Aftercare following joint replacement surgery: Secondary | ICD-10-CM | POA: Diagnosis not present

## 2021-10-06 DIAGNOSIS — G7000 Myasthenia gravis without (acute) exacerbation: Secondary | ICD-10-CM | POA: Diagnosis not present

## 2021-10-06 DIAGNOSIS — S71001D Unspecified open wound, right hip, subsequent encounter: Secondary | ICD-10-CM | POA: Diagnosis not present

## 2021-10-06 DIAGNOSIS — M6281 Muscle weakness (generalized): Secondary | ICD-10-CM | POA: Diagnosis not present

## 2021-10-06 DIAGNOSIS — Z7901 Long term (current) use of anticoagulants: Secondary | ICD-10-CM | POA: Diagnosis not present

## 2021-10-06 DIAGNOSIS — R197 Diarrhea, unspecified: Secondary | ICD-10-CM | POA: Diagnosis not present

## 2021-10-06 DIAGNOSIS — J09X2 Influenza due to identified novel influenza A virus with other respiratory manifestations: Secondary | ICD-10-CM | POA: Diagnosis not present

## 2021-10-06 DIAGNOSIS — N3000 Acute cystitis without hematuria: Secondary | ICD-10-CM | POA: Diagnosis not present

## 2021-10-06 DIAGNOSIS — R58 Hemorrhage, not elsewhere classified: Secondary | ICD-10-CM | POA: Diagnosis not present

## 2021-10-06 DIAGNOSIS — E860 Dehydration: Secondary | ICD-10-CM | POA: Diagnosis not present

## 2021-10-06 DIAGNOSIS — N766 Ulceration of vulva: Secondary | ICD-10-CM | POA: Diagnosis not present

## 2021-10-06 DIAGNOSIS — R319 Hematuria, unspecified: Secondary | ICD-10-CM | POA: Diagnosis not present

## 2021-10-06 DIAGNOSIS — I959 Hypotension, unspecified: Secondary | ICD-10-CM | POA: Diagnosis not present

## 2021-10-06 DIAGNOSIS — E119 Type 2 diabetes mellitus without complications: Secondary | ICD-10-CM | POA: Diagnosis not present

## 2021-10-06 DIAGNOSIS — Z5181 Encounter for therapeutic drug level monitoring: Secondary | ICD-10-CM | POA: Diagnosis not present

## 2021-10-06 DIAGNOSIS — D631 Anemia in chronic kidney disease: Secondary | ICD-10-CM | POA: Diagnosis not present

## 2021-10-06 DIAGNOSIS — F4323 Adjustment disorder with mixed anxiety and depressed mood: Secondary | ICD-10-CM | POA: Diagnosis not present

## 2021-10-06 DIAGNOSIS — L24A2 Irritant contact dermatitis due to fecal, urinary or dual incontinence: Secondary | ICD-10-CM | POA: Diagnosis not present

## 2021-10-06 DIAGNOSIS — U071 COVID-19: Secondary | ICD-10-CM | POA: Diagnosis not present

## 2021-10-06 DIAGNOSIS — R29810 Facial weakness: Secondary | ICD-10-CM | POA: Diagnosis not present

## 2021-10-06 DIAGNOSIS — L89890 Pressure ulcer of other site, unstageable: Secondary | ICD-10-CM | POA: Diagnosis not present

## 2021-10-06 DIAGNOSIS — L89313 Pressure ulcer of right buttock, stage 3: Secondary | ICD-10-CM | POA: Diagnosis not present

## 2021-10-06 DIAGNOSIS — M8580 Other specified disorders of bone density and structure, unspecified site: Secondary | ICD-10-CM | POA: Diagnosis not present

## 2021-10-06 DIAGNOSIS — J9600 Acute respiratory failure, unspecified whether with hypoxia or hypercapnia: Secondary | ICD-10-CM | POA: Diagnosis not present

## 2021-10-06 DIAGNOSIS — G7001 Myasthenia gravis with (acute) exacerbation: Secondary | ICD-10-CM | POA: Diagnosis not present

## 2021-10-06 DIAGNOSIS — S72001D Fracture of unspecified part of neck of right femur, subsequent encounter for closed fracture with routine healing: Secondary | ICD-10-CM | POA: Diagnosis not present

## 2021-10-06 DIAGNOSIS — R739 Hyperglycemia, unspecified: Secondary | ICD-10-CM | POA: Diagnosis not present

## 2021-10-06 DIAGNOSIS — E871 Hypo-osmolality and hyponatremia: Secondary | ICD-10-CM | POA: Diagnosis not present

## 2021-10-06 DIAGNOSIS — L89310 Pressure ulcer of right buttock, unstageable: Secondary | ICD-10-CM | POA: Diagnosis not present

## 2021-10-06 DIAGNOSIS — E1165 Type 2 diabetes mellitus with hyperglycemia: Secondary | ICD-10-CM | POA: Diagnosis not present

## 2021-10-06 DIAGNOSIS — B9629 Other Escherichia coli [E. coli] as the cause of diseases classified elsewhere: Secondary | ICD-10-CM | POA: Diagnosis not present

## 2021-10-06 DIAGNOSIS — R4781 Slurred speech: Secondary | ICD-10-CM | POA: Diagnosis not present

## 2021-10-06 DIAGNOSIS — K219 Gastro-esophageal reflux disease without esophagitis: Secondary | ICD-10-CM | POA: Diagnosis not present

## 2021-10-06 DIAGNOSIS — R21 Rash and other nonspecific skin eruption: Secondary | ICD-10-CM | POA: Diagnosis not present

## 2021-10-06 DIAGNOSIS — I10 Essential (primary) hypertension: Secondary | ICD-10-CM | POA: Diagnosis not present

## 2021-10-06 DIAGNOSIS — R531 Weakness: Secondary | ICD-10-CM | POA: Diagnosis not present

## 2021-10-06 DIAGNOSIS — Z1159 Encounter for screening for other viral diseases: Secondary | ICD-10-CM | POA: Diagnosis not present

## 2021-10-06 DIAGNOSIS — R079 Chest pain, unspecified: Secondary | ICD-10-CM | POA: Diagnosis not present

## 2021-10-06 DIAGNOSIS — L89323 Pressure ulcer of left buttock, stage 3: Secondary | ICD-10-CM | POA: Diagnosis not present

## 2021-10-06 DIAGNOSIS — R52 Pain, unspecified: Secondary | ICD-10-CM | POA: Diagnosis not present

## 2021-10-06 DIAGNOSIS — R0602 Shortness of breath: Secondary | ICD-10-CM | POA: Diagnosis not present

## 2021-10-06 DIAGNOSIS — G709 Myoneural disorder, unspecified: Secondary | ICD-10-CM | POA: Diagnosis not present

## 2021-10-06 DIAGNOSIS — L89153 Pressure ulcer of sacral region, stage 3: Secondary | ICD-10-CM | POA: Diagnosis not present

## 2021-10-06 DIAGNOSIS — H538 Other visual disturbances: Secondary | ICD-10-CM | POA: Diagnosis not present

## 2021-10-06 DIAGNOSIS — R293 Abnormal posture: Secondary | ICD-10-CM | POA: Diagnosis not present

## 2021-10-06 DIAGNOSIS — Z7984 Long term (current) use of oral hypoglycemic drugs: Secondary | ICD-10-CM | POA: Diagnosis not present

## 2021-10-06 DIAGNOSIS — N390 Urinary tract infection, site not specified: Secondary | ICD-10-CM | POA: Diagnosis not present

## 2021-10-06 DIAGNOSIS — E785 Hyperlipidemia, unspecified: Secondary | ICD-10-CM | POA: Diagnosis not present

## 2021-10-06 DIAGNOSIS — E46 Unspecified protein-calorie malnutrition: Secondary | ICD-10-CM | POA: Diagnosis not present

## 2021-10-06 DIAGNOSIS — L03115 Cellulitis of right lower limb: Secondary | ICD-10-CM | POA: Diagnosis not present

## 2021-10-06 DIAGNOSIS — E11649 Type 2 diabetes mellitus with hypoglycemia without coma: Secondary | ICD-10-CM | POA: Diagnosis not present

## 2021-10-06 DIAGNOSIS — E876 Hypokalemia: Secondary | ICD-10-CM | POA: Diagnosis not present

## 2021-10-06 DIAGNOSIS — R262 Difficulty in walking, not elsewhere classified: Secondary | ICD-10-CM | POA: Diagnosis not present

## 2021-10-06 DIAGNOSIS — I480 Paroxysmal atrial fibrillation: Secondary | ICD-10-CM | POA: Diagnosis not present

## 2021-10-06 DIAGNOSIS — D84821 Immunodeficiency due to drugs: Secondary | ICD-10-CM | POA: Diagnosis not present

## 2021-10-06 DIAGNOSIS — B9620 Unspecified Escherichia coli [E. coli] as the cause of diseases classified elsewhere: Secondary | ICD-10-CM | POA: Diagnosis not present

## 2021-10-06 DIAGNOSIS — Z950 Presence of cardiac pacemaker: Secondary | ICD-10-CM | POA: Diagnosis not present

## 2021-10-07 DIAGNOSIS — I482 Chronic atrial fibrillation, unspecified: Secondary | ICD-10-CM | POA: Diagnosis not present

## 2021-10-07 DIAGNOSIS — I509 Heart failure, unspecified: Secondary | ICD-10-CM | POA: Diagnosis not present

## 2021-10-07 DIAGNOSIS — G7000 Myasthenia gravis without (acute) exacerbation: Secondary | ICD-10-CM | POA: Diagnosis not present

## 2021-10-07 DIAGNOSIS — M6281 Muscle weakness (generalized): Secondary | ICD-10-CM | POA: Diagnosis not present

## 2021-10-07 DIAGNOSIS — I10 Essential (primary) hypertension: Secondary | ICD-10-CM | POA: Diagnosis not present

## 2021-10-07 DIAGNOSIS — E119 Type 2 diabetes mellitus without complications: Secondary | ICD-10-CM | POA: Diagnosis not present

## 2021-10-07 DIAGNOSIS — R609 Edema, unspecified: Secondary | ICD-10-CM | POA: Diagnosis not present

## 2021-10-07 DIAGNOSIS — G709 Myoneural disorder, unspecified: Secondary | ICD-10-CM | POA: Diagnosis not present

## 2021-10-07 DIAGNOSIS — Z471 Aftercare following joint replacement surgery: Secondary | ICD-10-CM | POA: Diagnosis not present

## 2021-10-07 DIAGNOSIS — M8580 Other specified disorders of bone density and structure, unspecified site: Secondary | ICD-10-CM | POA: Diagnosis not present

## 2021-10-07 DIAGNOSIS — R52 Pain, unspecified: Secondary | ICD-10-CM | POA: Diagnosis not present

## 2021-10-07 DIAGNOSIS — S72001D Fracture of unspecified part of neck of right femur, subsequent encounter for closed fracture with routine healing: Secondary | ICD-10-CM | POA: Diagnosis not present

## 2021-10-08 DIAGNOSIS — G7000 Myasthenia gravis without (acute) exacerbation: Secondary | ICD-10-CM | POA: Diagnosis not present

## 2021-10-08 DIAGNOSIS — S72001D Fracture of unspecified part of neck of right femur, subsequent encounter for closed fracture with routine healing: Secondary | ICD-10-CM | POA: Diagnosis not present

## 2021-10-08 DIAGNOSIS — E119 Type 2 diabetes mellitus without complications: Secondary | ICD-10-CM | POA: Diagnosis not present

## 2021-10-08 DIAGNOSIS — I482 Chronic atrial fibrillation, unspecified: Secondary | ICD-10-CM | POA: Diagnosis not present

## 2021-10-08 DIAGNOSIS — I10 Essential (primary) hypertension: Secondary | ICD-10-CM | POA: Diagnosis not present

## 2021-10-08 DIAGNOSIS — I509 Heart failure, unspecified: Secondary | ICD-10-CM | POA: Diagnosis not present

## 2021-10-10 DIAGNOSIS — L89893 Pressure ulcer of other site, stage 3: Secondary | ICD-10-CM | POA: Diagnosis not present

## 2021-10-10 DIAGNOSIS — L89153 Pressure ulcer of sacral region, stage 3: Secondary | ICD-10-CM | POA: Diagnosis not present

## 2021-10-10 DIAGNOSIS — S71001D Unspecified open wound, right hip, subsequent encounter: Secondary | ICD-10-CM | POA: Diagnosis not present

## 2021-10-10 DIAGNOSIS — E119 Type 2 diabetes mellitus without complications: Secondary | ICD-10-CM | POA: Diagnosis not present

## 2021-10-10 DIAGNOSIS — G7000 Myasthenia gravis without (acute) exacerbation: Secondary | ICD-10-CM | POA: Diagnosis not present

## 2021-10-14 DIAGNOSIS — L89153 Pressure ulcer of sacral region, stage 3: Secondary | ICD-10-CM | POA: Diagnosis not present

## 2021-10-14 DIAGNOSIS — E119 Type 2 diabetes mellitus without complications: Secondary | ICD-10-CM | POA: Diagnosis not present

## 2021-10-16 DIAGNOSIS — I951 Orthostatic hypotension: Secondary | ICD-10-CM | POA: Diagnosis not present

## 2021-10-16 DIAGNOSIS — I482 Chronic atrial fibrillation, unspecified: Secondary | ICD-10-CM | POA: Diagnosis not present

## 2021-10-16 DIAGNOSIS — E860 Dehydration: Secondary | ICD-10-CM | POA: Diagnosis not present

## 2021-10-16 DIAGNOSIS — M6281 Muscle weakness (generalized): Secondary | ICD-10-CM | POA: Diagnosis not present

## 2021-10-16 DIAGNOSIS — K219 Gastro-esophageal reflux disease without esophagitis: Secondary | ICD-10-CM | POA: Diagnosis not present

## 2021-10-16 DIAGNOSIS — G7000 Myasthenia gravis without (acute) exacerbation: Secondary | ICD-10-CM | POA: Diagnosis not present

## 2021-10-16 DIAGNOSIS — E119 Type 2 diabetes mellitus without complications: Secondary | ICD-10-CM | POA: Diagnosis not present

## 2021-10-17 DIAGNOSIS — R609 Edema, unspecified: Secondary | ICD-10-CM | POA: Diagnosis not present

## 2021-10-17 DIAGNOSIS — L89153 Pressure ulcer of sacral region, stage 3: Secondary | ICD-10-CM | POA: Diagnosis not present

## 2021-10-17 DIAGNOSIS — M6281 Muscle weakness (generalized): Secondary | ICD-10-CM | POA: Diagnosis not present

## 2021-10-17 DIAGNOSIS — I10 Essential (primary) hypertension: Secondary | ICD-10-CM | POA: Diagnosis not present

## 2021-10-17 DIAGNOSIS — G7000 Myasthenia gravis without (acute) exacerbation: Secondary | ICD-10-CM | POA: Diagnosis not present

## 2021-10-17 DIAGNOSIS — Z5181 Encounter for therapeutic drug level monitoring: Secondary | ICD-10-CM | POA: Diagnosis not present

## 2021-10-17 DIAGNOSIS — I509 Heart failure, unspecified: Secondary | ICD-10-CM | POA: Diagnosis not present

## 2021-10-17 DIAGNOSIS — S71001D Unspecified open wound, right hip, subsequent encounter: Secondary | ICD-10-CM | POA: Diagnosis not present

## 2021-10-18 DIAGNOSIS — I951 Orthostatic hypotension: Secondary | ICD-10-CM | POA: Diagnosis not present

## 2021-10-18 DIAGNOSIS — E860 Dehydration: Secondary | ICD-10-CM | POA: Diagnosis not present

## 2021-10-18 DIAGNOSIS — E119 Type 2 diabetes mellitus without complications: Secondary | ICD-10-CM | POA: Diagnosis not present

## 2021-10-18 DIAGNOSIS — G7000 Myasthenia gravis without (acute) exacerbation: Secondary | ICD-10-CM | POA: Diagnosis not present

## 2021-10-22 DIAGNOSIS — M6281 Muscle weakness (generalized): Secondary | ICD-10-CM | POA: Diagnosis not present

## 2021-10-22 DIAGNOSIS — E119 Type 2 diabetes mellitus without complications: Secondary | ICD-10-CM | POA: Diagnosis not present

## 2021-10-22 DIAGNOSIS — L89153 Pressure ulcer of sacral region, stage 3: Secondary | ICD-10-CM | POA: Diagnosis not present

## 2021-10-22 DIAGNOSIS — R300 Dysuria: Secondary | ICD-10-CM | POA: Diagnosis not present

## 2021-10-22 DIAGNOSIS — R10819 Abdominal tenderness, unspecified site: Secondary | ICD-10-CM | POA: Diagnosis not present

## 2021-10-22 DIAGNOSIS — R52 Pain, unspecified: Secondary | ICD-10-CM | POA: Diagnosis not present

## 2021-10-23 DIAGNOSIS — R319 Hematuria, unspecified: Secondary | ICD-10-CM | POA: Diagnosis not present

## 2021-10-23 DIAGNOSIS — R10819 Abdominal tenderness, unspecified site: Secondary | ICD-10-CM | POA: Diagnosis not present

## 2021-10-23 DIAGNOSIS — R300 Dysuria: Secondary | ICD-10-CM | POA: Diagnosis not present

## 2021-10-23 DIAGNOSIS — N390 Urinary tract infection, site not specified: Secondary | ICD-10-CM | POA: Diagnosis not present

## 2021-10-24 DIAGNOSIS — G7000 Myasthenia gravis without (acute) exacerbation: Secondary | ICD-10-CM | POA: Diagnosis not present

## 2021-10-24 DIAGNOSIS — L89153 Pressure ulcer of sacral region, stage 3: Secondary | ICD-10-CM | POA: Diagnosis not present

## 2021-10-24 DIAGNOSIS — M6281 Muscle weakness (generalized): Secondary | ICD-10-CM | POA: Diagnosis not present

## 2021-10-24 DIAGNOSIS — S71001D Unspecified open wound, right hip, subsequent encounter: Secondary | ICD-10-CM | POA: Diagnosis not present

## 2021-10-25 DIAGNOSIS — R300 Dysuria: Secondary | ICD-10-CM | POA: Diagnosis not present

## 2021-10-25 DIAGNOSIS — G7000 Myasthenia gravis without (acute) exacerbation: Secondary | ICD-10-CM | POA: Diagnosis not present

## 2021-10-25 DIAGNOSIS — N390 Urinary tract infection, site not specified: Secondary | ICD-10-CM | POA: Diagnosis not present

## 2021-10-25 DIAGNOSIS — R10819 Abdominal tenderness, unspecified site: Secondary | ICD-10-CM | POA: Diagnosis not present

## 2021-10-28 DIAGNOSIS — L89153 Pressure ulcer of sacral region, stage 3: Secondary | ICD-10-CM | POA: Diagnosis not present

## 2021-10-28 DIAGNOSIS — R52 Pain, unspecified: Secondary | ICD-10-CM | POA: Diagnosis not present

## 2021-10-29 DIAGNOSIS — I951 Orthostatic hypotension: Secondary | ICD-10-CM | POA: Diagnosis not present

## 2021-10-29 DIAGNOSIS — G7000 Myasthenia gravis without (acute) exacerbation: Secondary | ICD-10-CM | POA: Diagnosis not present

## 2021-10-29 DIAGNOSIS — I959 Hypotension, unspecified: Secondary | ICD-10-CM | POA: Diagnosis not present

## 2021-10-29 DIAGNOSIS — M6281 Muscle weakness (generalized): Secondary | ICD-10-CM | POA: Diagnosis not present

## 2021-10-30 DIAGNOSIS — G7000 Myasthenia gravis without (acute) exacerbation: Secondary | ICD-10-CM | POA: Diagnosis not present

## 2021-10-30 DIAGNOSIS — E860 Dehydration: Secondary | ICD-10-CM | POA: Diagnosis not present

## 2021-10-30 DIAGNOSIS — I959 Hypotension, unspecified: Secondary | ICD-10-CM | POA: Diagnosis not present

## 2021-10-30 DIAGNOSIS — I10 Essential (primary) hypertension: Secondary | ICD-10-CM | POA: Diagnosis not present

## 2021-10-31 DIAGNOSIS — L24A2 Irritant contact dermatitis due to fecal, urinary or dual incontinence: Secondary | ICD-10-CM | POA: Diagnosis not present

## 2021-10-31 DIAGNOSIS — L89153 Pressure ulcer of sacral region, stage 3: Secondary | ICD-10-CM | POA: Diagnosis not present

## 2021-10-31 DIAGNOSIS — L89310 Pressure ulcer of right buttock, unstageable: Secondary | ICD-10-CM | POA: Diagnosis not present

## 2021-10-31 DIAGNOSIS — L89890 Pressure ulcer of other site, unstageable: Secondary | ICD-10-CM | POA: Diagnosis not present

## 2021-11-01 DIAGNOSIS — N766 Ulceration of vulva: Secondary | ICD-10-CM | POA: Diagnosis not present

## 2021-11-01 DIAGNOSIS — I10 Essential (primary) hypertension: Secondary | ICD-10-CM | POA: Diagnosis not present

## 2021-11-03 DIAGNOSIS — M8580 Other specified disorders of bone density and structure, unspecified site: Secondary | ICD-10-CM | POA: Diagnosis not present

## 2021-11-03 DIAGNOSIS — Z950 Presence of cardiac pacemaker: Secondary | ICD-10-CM | POA: Diagnosis not present

## 2021-11-03 DIAGNOSIS — G7000 Myasthenia gravis without (acute) exacerbation: Secondary | ICD-10-CM | POA: Diagnosis not present

## 2021-11-03 DIAGNOSIS — E119 Type 2 diabetes mellitus without complications: Secondary | ICD-10-CM | POA: Diagnosis not present

## 2021-11-03 DIAGNOSIS — R739 Hyperglycemia, unspecified: Secondary | ICD-10-CM | POA: Diagnosis not present

## 2021-11-03 DIAGNOSIS — R4781 Slurred speech: Secondary | ICD-10-CM | POA: Diagnosis not present

## 2021-11-03 DIAGNOSIS — E876 Hypokalemia: Secondary | ICD-10-CM | POA: Diagnosis not present

## 2021-11-03 DIAGNOSIS — L03115 Cellulitis of right lower limb: Secondary | ICD-10-CM | POA: Diagnosis not present

## 2021-11-03 DIAGNOSIS — R29810 Facial weakness: Secondary | ICD-10-CM | POA: Diagnosis not present

## 2021-11-03 DIAGNOSIS — Z1159 Encounter for screening for other viral diseases: Secondary | ICD-10-CM | POA: Diagnosis not present

## 2021-11-03 DIAGNOSIS — N390 Urinary tract infection, site not specified: Secondary | ICD-10-CM | POA: Diagnosis not present

## 2021-11-03 DIAGNOSIS — S72001D Fracture of unspecified part of neck of right femur, subsequent encounter for closed fracture with routine healing: Secondary | ICD-10-CM | POA: Diagnosis not present

## 2021-11-03 DIAGNOSIS — E785 Hyperlipidemia, unspecified: Secondary | ICD-10-CM | POA: Diagnosis not present

## 2021-11-03 DIAGNOSIS — E860 Dehydration: Secondary | ICD-10-CM | POA: Diagnosis not present

## 2021-11-03 DIAGNOSIS — G7001 Myasthenia gravis with (acute) exacerbation: Secondary | ICD-10-CM | POA: Diagnosis present

## 2021-11-03 DIAGNOSIS — R531 Weakness: Secondary | ICD-10-CM | POA: Diagnosis not present

## 2021-11-03 DIAGNOSIS — R58 Hemorrhage, not elsewhere classified: Secondary | ICD-10-CM | POA: Diagnosis not present

## 2021-11-03 DIAGNOSIS — I482 Chronic atrial fibrillation, unspecified: Secondary | ICD-10-CM | POA: Diagnosis not present

## 2021-11-03 DIAGNOSIS — L89323 Pressure ulcer of left buttock, stage 3: Secondary | ICD-10-CM | POA: Diagnosis not present

## 2021-11-03 DIAGNOSIS — L89153 Pressure ulcer of sacral region, stage 3: Secondary | ICD-10-CM | POA: Diagnosis not present

## 2021-11-03 DIAGNOSIS — M6281 Muscle weakness (generalized): Secondary | ICD-10-CM | POA: Diagnosis not present

## 2021-11-03 DIAGNOSIS — K219 Gastro-esophageal reflux disease without esophagitis: Secondary | ICD-10-CM | POA: Diagnosis not present

## 2021-11-03 DIAGNOSIS — U071 COVID-19: Secondary | ICD-10-CM | POA: Diagnosis not present

## 2021-11-03 DIAGNOSIS — G709 Myoneural disorder, unspecified: Secondary | ICD-10-CM | POA: Diagnosis not present

## 2021-11-03 DIAGNOSIS — Z471 Aftercare following joint replacement surgery: Secondary | ICD-10-CM | POA: Diagnosis not present

## 2021-11-03 DIAGNOSIS — R262 Difficulty in walking, not elsewhere classified: Secondary | ICD-10-CM | POA: Diagnosis not present

## 2021-11-03 DIAGNOSIS — L89313 Pressure ulcer of right buttock, stage 3: Secondary | ICD-10-CM | POA: Diagnosis not present

## 2021-11-03 DIAGNOSIS — R0602 Shortness of breath: Secondary | ICD-10-CM | POA: Diagnosis not present

## 2021-11-03 DIAGNOSIS — D84821 Immunodeficiency due to drugs: Secondary | ICD-10-CM | POA: Diagnosis not present

## 2021-11-03 DIAGNOSIS — I10 Essential (primary) hypertension: Secondary | ICD-10-CM | POA: Diagnosis not present

## 2021-11-03 DIAGNOSIS — Z7901 Long term (current) use of anticoagulants: Secondary | ICD-10-CM | POA: Diagnosis not present

## 2021-11-03 DIAGNOSIS — R1312 Dysphagia, oropharyngeal phase: Secondary | ICD-10-CM | POA: Diagnosis not present

## 2021-11-03 DIAGNOSIS — E871 Hypo-osmolality and hyponatremia: Secondary | ICD-10-CM | POA: Diagnosis present

## 2021-11-03 DIAGNOSIS — R293 Abnormal posture: Secondary | ICD-10-CM | POA: Diagnosis not present

## 2021-11-03 DIAGNOSIS — E1165 Type 2 diabetes mellitus with hyperglycemia: Secondary | ICD-10-CM | POA: Diagnosis present

## 2021-11-03 DIAGNOSIS — D631 Anemia in chronic kidney disease: Secondary | ICD-10-CM | POA: Diagnosis present

## 2021-11-03 DIAGNOSIS — N3000 Acute cystitis without hematuria: Secondary | ICD-10-CM | POA: Diagnosis not present

## 2021-11-03 DIAGNOSIS — E11649 Type 2 diabetes mellitus with hypoglycemia without coma: Secondary | ICD-10-CM | POA: Diagnosis not present

## 2021-11-03 DIAGNOSIS — R079 Chest pain, unspecified: Secondary | ICD-10-CM | POA: Diagnosis not present

## 2021-11-03 DIAGNOSIS — Z7984 Long term (current) use of oral hypoglycemic drugs: Secondary | ICD-10-CM | POA: Diagnosis not present

## 2021-11-03 DIAGNOSIS — R21 Rash and other nonspecific skin eruption: Secondary | ICD-10-CM | POA: Diagnosis not present

## 2021-11-03 DIAGNOSIS — I509 Heart failure, unspecified: Secondary | ICD-10-CM | POA: Diagnosis not present

## 2021-11-03 DIAGNOSIS — H538 Other visual disturbances: Secondary | ICD-10-CM | POA: Diagnosis not present

## 2021-11-03 DIAGNOSIS — R197 Diarrhea, unspecified: Secondary | ICD-10-CM | POA: Diagnosis not present

## 2021-11-03 DIAGNOSIS — E46 Unspecified protein-calorie malnutrition: Secondary | ICD-10-CM | POA: Diagnosis present

## 2021-11-03 DIAGNOSIS — R300 Dysuria: Secondary | ICD-10-CM | POA: Diagnosis not present

## 2021-11-03 DIAGNOSIS — I480 Paroxysmal atrial fibrillation: Secondary | ICD-10-CM | POA: Diagnosis not present

## 2021-11-04 DIAGNOSIS — N390 Urinary tract infection, site not specified: Secondary | ICD-10-CM | POA: Diagnosis not present

## 2021-11-04 DIAGNOSIS — I509 Heart failure, unspecified: Secondary | ICD-10-CM | POA: Diagnosis not present

## 2021-11-04 DIAGNOSIS — M6281 Muscle weakness (generalized): Secondary | ICD-10-CM | POA: Diagnosis not present

## 2021-11-04 DIAGNOSIS — G7000 Myasthenia gravis without (acute) exacerbation: Secondary | ICD-10-CM | POA: Diagnosis not present

## 2021-11-04 DIAGNOSIS — I10 Essential (primary) hypertension: Secondary | ICD-10-CM | POA: Diagnosis not present

## 2021-11-04 DIAGNOSIS — E860 Dehydration: Secondary | ICD-10-CM | POA: Diagnosis not present

## 2021-11-04 DIAGNOSIS — I482 Chronic atrial fibrillation, unspecified: Secondary | ICD-10-CM | POA: Diagnosis not present

## 2021-11-04 DIAGNOSIS — G709 Myoneural disorder, unspecified: Secondary | ICD-10-CM | POA: Diagnosis not present

## 2021-11-12 DIAGNOSIS — R0902 Hypoxemia: Secondary | ICD-10-CM | POA: Diagnosis not present

## 2021-11-12 DIAGNOSIS — E871 Hypo-osmolality and hyponatremia: Secondary | ICD-10-CM | POA: Diagnosis not present

## 2021-11-12 DIAGNOSIS — Z1621 Resistance to vancomycin: Secondary | ICD-10-CM | POA: Diagnosis not present

## 2021-11-12 DIAGNOSIS — L89323 Pressure ulcer of left buttock, stage 3: Secondary | ICD-10-CM | POA: Diagnosis not present

## 2021-11-12 DIAGNOSIS — G709 Myoneural disorder, unspecified: Secondary | ICD-10-CM | POA: Diagnosis not present

## 2021-11-12 DIAGNOSIS — I10 Essential (primary) hypertension: Secondary | ICD-10-CM | POA: Diagnosis not present

## 2021-11-12 DIAGNOSIS — R262 Difficulty in walking, not elsewhere classified: Secondary | ICD-10-CM | POA: Diagnosis not present

## 2021-11-12 DIAGNOSIS — Z950 Presence of cardiac pacemaker: Secondary | ICD-10-CM | POA: Diagnosis not present

## 2021-11-12 DIAGNOSIS — M6281 Muscle weakness (generalized): Secondary | ICD-10-CM | POA: Diagnosis not present

## 2021-11-12 DIAGNOSIS — R293 Abnormal posture: Secondary | ICD-10-CM | POA: Diagnosis not present

## 2021-11-12 DIAGNOSIS — I480 Paroxysmal atrial fibrillation: Secondary | ICD-10-CM | POA: Diagnosis not present

## 2021-11-12 DIAGNOSIS — S72001D Fracture of unspecified part of neck of right femur, subsequent encounter for closed fracture with routine healing: Secondary | ICD-10-CM | POA: Diagnosis not present

## 2021-11-12 DIAGNOSIS — N390 Urinary tract infection, site not specified: Secondary | ICD-10-CM | POA: Diagnosis not present

## 2021-11-12 DIAGNOSIS — E860 Dehydration: Secondary | ICD-10-CM | POA: Diagnosis not present

## 2021-11-12 DIAGNOSIS — L24A2 Irritant contact dermatitis due to fecal, urinary or dual incontinence: Secondary | ICD-10-CM | POA: Diagnosis not present

## 2021-11-12 DIAGNOSIS — U071 COVID-19: Secondary | ICD-10-CM | POA: Diagnosis not present

## 2021-11-12 DIAGNOSIS — R112 Nausea with vomiting, unspecified: Secondary | ICD-10-CM | POA: Diagnosis not present

## 2021-11-12 DIAGNOSIS — I951 Orthostatic hypotension: Secondary | ICD-10-CM | POA: Diagnosis not present

## 2021-11-12 DIAGNOSIS — R1312 Dysphagia, oropharyngeal phase: Secondary | ICD-10-CM | POA: Diagnosis not present

## 2021-11-12 DIAGNOSIS — R197 Diarrhea, unspecified: Secondary | ICD-10-CM | POA: Diagnosis not present

## 2021-11-12 DIAGNOSIS — R300 Dysuria: Secondary | ICD-10-CM | POA: Diagnosis not present

## 2021-11-12 DIAGNOSIS — L03115 Cellulitis of right lower limb: Secondary | ICD-10-CM | POA: Diagnosis not present

## 2021-11-12 DIAGNOSIS — R0989 Other specified symptoms and signs involving the circulatory and respiratory systems: Secondary | ICD-10-CM | POA: Diagnosis not present

## 2021-11-12 DIAGNOSIS — E873 Alkalosis: Secondary | ICD-10-CM | POA: Diagnosis not present

## 2021-11-12 DIAGNOSIS — R531 Weakness: Secondary | ICD-10-CM | POA: Diagnosis not present

## 2021-11-12 DIAGNOSIS — L89313 Pressure ulcer of right buttock, stage 3: Secondary | ICD-10-CM | POA: Diagnosis not present

## 2021-11-12 DIAGNOSIS — L89153 Pressure ulcer of sacral region, stage 3: Secondary | ICD-10-CM | POA: Diagnosis not present

## 2021-11-12 DIAGNOSIS — I4819 Other persistent atrial fibrillation: Secondary | ICD-10-CM | POA: Diagnosis not present

## 2021-11-12 DIAGNOSIS — I509 Heart failure, unspecified: Secondary | ICD-10-CM | POA: Diagnosis not present

## 2021-11-12 DIAGNOSIS — B952 Enterococcus as the cause of diseases classified elsewhere: Secondary | ICD-10-CM | POA: Diagnosis not present

## 2021-11-12 DIAGNOSIS — R231 Pallor: Secondary | ICD-10-CM | POA: Diagnosis not present

## 2021-11-12 DIAGNOSIS — R571 Hypovolemic shock: Secondary | ICD-10-CM | POA: Diagnosis not present

## 2021-11-12 DIAGNOSIS — I21A1 Myocardial infarction type 2: Secondary | ICD-10-CM | POA: Diagnosis not present

## 2021-11-12 DIAGNOSIS — R7881 Bacteremia: Secondary | ICD-10-CM | POA: Diagnosis not present

## 2021-11-12 DIAGNOSIS — I482 Chronic atrial fibrillation, unspecified: Secondary | ICD-10-CM | POA: Diagnosis not present

## 2021-11-12 DIAGNOSIS — F4323 Adjustment disorder with mixed anxiety and depressed mood: Secondary | ICD-10-CM | POA: Diagnosis not present

## 2021-11-12 DIAGNOSIS — E876 Hypokalemia: Secondary | ICD-10-CM | POA: Diagnosis not present

## 2021-11-12 DIAGNOSIS — D84821 Immunodeficiency due to drugs: Secondary | ICD-10-CM | POA: Diagnosis not present

## 2021-11-12 DIAGNOSIS — E785 Hyperlipidemia, unspecified: Secondary | ICD-10-CM | POA: Diagnosis not present

## 2021-11-12 DIAGNOSIS — R110 Nausea: Secondary | ICD-10-CM | POA: Diagnosis not present

## 2021-11-12 DIAGNOSIS — Z1159 Encounter for screening for other viral diseases: Secondary | ICD-10-CM | POA: Diagnosis not present

## 2021-11-12 DIAGNOSIS — G7000 Myasthenia gravis without (acute) exacerbation: Secondary | ICD-10-CM | POA: Diagnosis not present

## 2021-11-12 DIAGNOSIS — Z471 Aftercare following joint replacement surgery: Secondary | ICD-10-CM | POA: Diagnosis not present

## 2021-11-12 DIAGNOSIS — I4891 Unspecified atrial fibrillation: Secondary | ICD-10-CM | POA: Diagnosis not present

## 2021-11-12 DIAGNOSIS — E119 Type 2 diabetes mellitus without complications: Secondary | ICD-10-CM | POA: Diagnosis not present

## 2021-11-12 DIAGNOSIS — T465X5A Adverse effect of other antihypertensive drugs, initial encounter: Secondary | ICD-10-CM | POA: Diagnosis not present

## 2021-11-12 DIAGNOSIS — F5101 Primary insomnia: Secondary | ICD-10-CM | POA: Diagnosis not present

## 2021-11-12 DIAGNOSIS — E872 Acidosis, unspecified: Secondary | ICD-10-CM | POA: Diagnosis not present

## 2021-11-12 DIAGNOSIS — I959 Hypotension, unspecified: Secondary | ICD-10-CM | POA: Diagnosis not present

## 2021-11-12 DIAGNOSIS — R079 Chest pain, unspecified: Secondary | ICD-10-CM | POA: Diagnosis not present

## 2021-11-12 DIAGNOSIS — E46 Unspecified protein-calorie malnutrition: Secondary | ICD-10-CM | POA: Diagnosis not present

## 2021-11-12 DIAGNOSIS — D649 Anemia, unspecified: Secondary | ICD-10-CM | POA: Diagnosis not present

## 2021-11-12 DIAGNOSIS — M8580 Other specified disorders of bone density and structure, unspecified site: Secondary | ICD-10-CM | POA: Diagnosis not present

## 2021-11-13 DIAGNOSIS — I951 Orthostatic hypotension: Secondary | ICD-10-CM | POA: Diagnosis not present

## 2021-11-13 DIAGNOSIS — N390 Urinary tract infection, site not specified: Secondary | ICD-10-CM | POA: Diagnosis not present

## 2021-11-13 DIAGNOSIS — G7000 Myasthenia gravis without (acute) exacerbation: Secondary | ICD-10-CM | POA: Diagnosis not present

## 2021-11-13 DIAGNOSIS — L89153 Pressure ulcer of sacral region, stage 3: Secondary | ICD-10-CM | POA: Diagnosis not present

## 2021-11-13 DIAGNOSIS — I509 Heart failure, unspecified: Secondary | ICD-10-CM | POA: Diagnosis not present

## 2021-11-13 DIAGNOSIS — E860 Dehydration: Secondary | ICD-10-CM | POA: Diagnosis not present

## 2021-11-13 DIAGNOSIS — I482 Chronic atrial fibrillation, unspecified: Secondary | ICD-10-CM | POA: Diagnosis not present

## 2021-11-13 DIAGNOSIS — E119 Type 2 diabetes mellitus without complications: Secondary | ICD-10-CM | POA: Diagnosis not present

## 2021-11-13 DIAGNOSIS — I10 Essential (primary) hypertension: Secondary | ICD-10-CM | POA: Diagnosis not present

## 2021-11-14 DIAGNOSIS — I10 Essential (primary) hypertension: Secondary | ICD-10-CM | POA: Diagnosis not present

## 2021-11-14 DIAGNOSIS — L24A2 Irritant contact dermatitis due to fecal, urinary or dual incontinence: Secondary | ICD-10-CM | POA: Diagnosis not present

## 2021-11-14 DIAGNOSIS — M6281 Muscle weakness (generalized): Secondary | ICD-10-CM | POA: Diagnosis not present

## 2021-11-14 DIAGNOSIS — F5101 Primary insomnia: Secondary | ICD-10-CM | POA: Diagnosis not present

## 2021-11-14 DIAGNOSIS — G7000 Myasthenia gravis without (acute) exacerbation: Secondary | ICD-10-CM | POA: Diagnosis not present

## 2021-11-14 DIAGNOSIS — L89313 Pressure ulcer of right buttock, stage 3: Secondary | ICD-10-CM | POA: Diagnosis not present

## 2021-11-14 DIAGNOSIS — F4323 Adjustment disorder with mixed anxiety and depressed mood: Secondary | ICD-10-CM | POA: Diagnosis not present

## 2021-11-14 DIAGNOSIS — L89323 Pressure ulcer of left buttock, stage 3: Secondary | ICD-10-CM | POA: Diagnosis not present

## 2021-11-14 DIAGNOSIS — U071 COVID-19: Secondary | ICD-10-CM | POA: Diagnosis not present

## 2021-11-14 DIAGNOSIS — I509 Heart failure, unspecified: Secondary | ICD-10-CM | POA: Diagnosis not present

## 2021-11-14 DIAGNOSIS — L89153 Pressure ulcer of sacral region, stage 3: Secondary | ICD-10-CM | POA: Diagnosis not present

## 2021-11-14 DIAGNOSIS — I482 Chronic atrial fibrillation, unspecified: Secondary | ICD-10-CM | POA: Diagnosis not present

## 2021-11-15 DIAGNOSIS — U071 COVID-19: Secondary | ICD-10-CM | POA: Diagnosis not present

## 2021-11-15 DIAGNOSIS — M6281 Muscle weakness (generalized): Secondary | ICD-10-CM | POA: Diagnosis not present

## 2021-11-15 DIAGNOSIS — R110 Nausea: Secondary | ICD-10-CM | POA: Diagnosis not present

## 2021-11-16 DIAGNOSIS — R571 Hypovolemic shock: Secondary | ICD-10-CM | POA: Diagnosis not present

## 2021-11-16 DIAGNOSIS — I959 Hypotension, unspecified: Secondary | ICD-10-CM | POA: Diagnosis not present

## 2021-11-16 DIAGNOSIS — R1312 Dysphagia, oropharyngeal phase: Secondary | ICD-10-CM | POA: Diagnosis not present

## 2021-11-16 DIAGNOSIS — I509 Heart failure, unspecified: Secondary | ICD-10-CM | POA: Diagnosis not present

## 2021-11-16 DIAGNOSIS — L89323 Pressure ulcer of left buttock, stage 3: Secondary | ICD-10-CM | POA: Diagnosis not present

## 2021-11-16 DIAGNOSIS — I480 Paroxysmal atrial fibrillation: Secondary | ICD-10-CM | POA: Diagnosis not present

## 2021-11-16 DIAGNOSIS — M8580 Other specified disorders of bone density and structure, unspecified site: Secondary | ICD-10-CM | POA: Diagnosis not present

## 2021-11-16 DIAGNOSIS — M6281 Muscle weakness (generalized): Secondary | ICD-10-CM | POA: Diagnosis not present

## 2021-11-16 DIAGNOSIS — S72001D Fracture of unspecified part of neck of right femur, subsequent encounter for closed fracture with routine healing: Secondary | ICD-10-CM | POA: Diagnosis not present

## 2021-11-16 DIAGNOSIS — Z950 Presence of cardiac pacemaker: Secondary | ICD-10-CM | POA: Diagnosis not present

## 2021-11-16 DIAGNOSIS — E873 Alkalosis: Secondary | ICD-10-CM | POA: Diagnosis not present

## 2021-11-16 DIAGNOSIS — R231 Pallor: Secondary | ICD-10-CM | POA: Diagnosis not present

## 2021-11-16 DIAGNOSIS — L89313 Pressure ulcer of right buttock, stage 3: Secondary | ICD-10-CM | POA: Diagnosis not present

## 2021-11-16 DIAGNOSIS — Z1621 Resistance to vancomycin: Secondary | ICD-10-CM | POA: Diagnosis not present

## 2021-11-16 DIAGNOSIS — T465X5A Adverse effect of other antihypertensive drugs, initial encounter: Secondary | ICD-10-CM | POA: Diagnosis not present

## 2021-11-16 DIAGNOSIS — E119 Type 2 diabetes mellitus without complications: Secondary | ICD-10-CM | POA: Diagnosis not present

## 2021-11-16 DIAGNOSIS — R531 Weakness: Secondary | ICD-10-CM | POA: Diagnosis not present

## 2021-11-16 DIAGNOSIS — D649 Anemia, unspecified: Secondary | ICD-10-CM | POA: Diagnosis not present

## 2021-11-16 DIAGNOSIS — M810 Age-related osteoporosis without current pathological fracture: Secondary | ICD-10-CM | POA: Diagnosis not present

## 2021-11-16 DIAGNOSIS — N390 Urinary tract infection, site not specified: Secondary | ICD-10-CM | POA: Diagnosis not present

## 2021-11-16 DIAGNOSIS — E871 Hypo-osmolality and hyponatremia: Secondary | ICD-10-CM | POA: Diagnosis not present

## 2021-11-16 DIAGNOSIS — Z2239 Carrier of other specified bacterial diseases: Secondary | ICD-10-CM | POA: Diagnosis not present

## 2021-11-16 DIAGNOSIS — R7881 Bacteremia: Secondary | ICD-10-CM | POA: Diagnosis not present

## 2021-11-16 DIAGNOSIS — L89153 Pressure ulcer of sacral region, stage 3: Secondary | ICD-10-CM | POA: Diagnosis not present

## 2021-11-16 DIAGNOSIS — R079 Chest pain, unspecified: Secondary | ICD-10-CM | POA: Diagnosis not present

## 2021-11-16 DIAGNOSIS — R0989 Other specified symptoms and signs involving the circulatory and respiratory systems: Secondary | ICD-10-CM | POA: Diagnosis not present

## 2021-11-16 DIAGNOSIS — E785 Hyperlipidemia, unspecified: Secondary | ICD-10-CM | POA: Diagnosis not present

## 2021-11-16 DIAGNOSIS — I4819 Other persistent atrial fibrillation: Secondary | ICD-10-CM | POA: Diagnosis not present

## 2021-11-16 DIAGNOSIS — D84821 Immunodeficiency due to drugs: Secondary | ICD-10-CM | POA: Diagnosis not present

## 2021-11-16 DIAGNOSIS — E876 Hypokalemia: Secondary | ICD-10-CM | POA: Diagnosis not present

## 2021-11-16 DIAGNOSIS — I4891 Unspecified atrial fibrillation: Secondary | ICD-10-CM | POA: Diagnosis not present

## 2021-11-16 DIAGNOSIS — R52 Pain, unspecified: Secondary | ICD-10-CM | POA: Diagnosis not present

## 2021-11-16 DIAGNOSIS — I10 Essential (primary) hypertension: Secondary | ICD-10-CM | POA: Diagnosis not present

## 2021-11-16 DIAGNOSIS — I21A1 Myocardial infarction type 2: Secondary | ICD-10-CM | POA: Diagnosis not present

## 2021-11-16 DIAGNOSIS — E46 Unspecified protein-calorie malnutrition: Secondary | ICD-10-CM | POA: Diagnosis not present

## 2021-11-16 DIAGNOSIS — G709 Myoneural disorder, unspecified: Secondary | ICD-10-CM | POA: Diagnosis not present

## 2021-11-16 DIAGNOSIS — U071 COVID-19: Secondary | ICD-10-CM | POA: Diagnosis not present

## 2021-11-16 DIAGNOSIS — J9601 Acute respiratory failure with hypoxia: Secondary | ICD-10-CM | POA: Diagnosis not present

## 2021-11-16 DIAGNOSIS — L03115 Cellulitis of right lower limb: Secondary | ICD-10-CM | POA: Diagnosis not present

## 2021-11-16 DIAGNOSIS — R609 Edema, unspecified: Secondary | ICD-10-CM | POA: Diagnosis not present

## 2021-11-16 DIAGNOSIS — R300 Dysuria: Secondary | ICD-10-CM | POA: Diagnosis not present

## 2021-11-16 DIAGNOSIS — E872 Acidosis, unspecified: Secondary | ICD-10-CM | POA: Diagnosis not present

## 2021-11-16 DIAGNOSIS — R293 Abnormal posture: Secondary | ICD-10-CM | POA: Diagnosis not present

## 2021-11-16 DIAGNOSIS — R0902 Hypoxemia: Secondary | ICD-10-CM | POA: Diagnosis not present

## 2021-11-16 DIAGNOSIS — B952 Enterococcus as the cause of diseases classified elsewhere: Secondary | ICD-10-CM | POA: Diagnosis not present

## 2021-11-16 DIAGNOSIS — R112 Nausea with vomiting, unspecified: Secondary | ICD-10-CM | POA: Diagnosis not present

## 2021-11-16 DIAGNOSIS — I951 Orthostatic hypotension: Secondary | ICD-10-CM | POA: Diagnosis not present

## 2021-11-16 DIAGNOSIS — G7000 Myasthenia gravis without (acute) exacerbation: Secondary | ICD-10-CM | POA: Diagnosis not present

## 2021-11-16 DIAGNOSIS — E860 Dehydration: Secondary | ICD-10-CM | POA: Diagnosis not present

## 2021-11-16 DIAGNOSIS — R262 Difficulty in walking, not elsewhere classified: Secondary | ICD-10-CM | POA: Diagnosis not present

## 2021-11-16 DIAGNOSIS — I482 Chronic atrial fibrillation, unspecified: Secondary | ICD-10-CM | POA: Diagnosis not present

## 2021-11-16 DIAGNOSIS — Z1159 Encounter for screening for other viral diseases: Secondary | ICD-10-CM | POA: Diagnosis not present

## 2021-11-16 DIAGNOSIS — R197 Diarrhea, unspecified: Secondary | ICD-10-CM | POA: Diagnosis not present

## 2021-11-17 DIAGNOSIS — R571 Hypovolemic shock: Secondary | ICD-10-CM | POA: Diagnosis not present

## 2021-11-18 DIAGNOSIS — E860 Dehydration: Secondary | ICD-10-CM | POA: Diagnosis not present

## 2021-11-18 DIAGNOSIS — G7000 Myasthenia gravis without (acute) exacerbation: Secondary | ICD-10-CM | POA: Diagnosis not present

## 2021-11-18 DIAGNOSIS — I951 Orthostatic hypotension: Secondary | ICD-10-CM | POA: Diagnosis not present

## 2021-11-18 DIAGNOSIS — M6281 Muscle weakness (generalized): Secondary | ICD-10-CM | POA: Diagnosis not present

## 2021-11-18 DIAGNOSIS — I482 Chronic atrial fibrillation, unspecified: Secondary | ICD-10-CM | POA: Diagnosis not present

## 2021-11-18 DIAGNOSIS — R571 Hypovolemic shock: Secondary | ICD-10-CM | POA: Diagnosis not present

## 2021-11-20 DIAGNOSIS — L89313 Pressure ulcer of right buttock, stage 3: Secondary | ICD-10-CM | POA: Diagnosis not present

## 2021-11-20 DIAGNOSIS — L89323 Pressure ulcer of left buttock, stage 3: Secondary | ICD-10-CM | POA: Diagnosis not present

## 2021-11-20 DIAGNOSIS — L89153 Pressure ulcer of sacral region, stage 3: Secondary | ICD-10-CM | POA: Diagnosis not present

## 2021-11-26 DIAGNOSIS — I491 Atrial premature depolarization: Secondary | ICD-10-CM | POA: Diagnosis not present

## 2021-11-26 DIAGNOSIS — Z66 Do not resuscitate: Secondary | ICD-10-CM | POA: Diagnosis not present

## 2021-11-26 DIAGNOSIS — L89323 Pressure ulcer of left buttock, stage 3: Secondary | ICD-10-CM | POA: Diagnosis not present

## 2021-11-26 DIAGNOSIS — E871 Hypo-osmolality and hyponatremia: Secondary | ICD-10-CM | POA: Diagnosis not present

## 2021-11-26 DIAGNOSIS — R262 Difficulty in walking, not elsewhere classified: Secondary | ICD-10-CM | POA: Diagnosis not present

## 2021-11-26 DIAGNOSIS — S72001D Fracture of unspecified part of neck of right femur, subsequent encounter for closed fracture with routine healing: Secondary | ICD-10-CM | POA: Diagnosis not present

## 2021-11-26 DIAGNOSIS — E8809 Other disorders of plasma-protein metabolism, not elsewhere classified: Secondary | ICD-10-CM | POA: Diagnosis not present

## 2021-11-26 DIAGNOSIS — R109 Unspecified abdominal pain: Secondary | ICD-10-CM | POA: Diagnosis not present

## 2021-11-26 DIAGNOSIS — M8580 Other specified disorders of bone density and structure, unspecified site: Secondary | ICD-10-CM | POA: Diagnosis not present

## 2021-11-26 DIAGNOSIS — R531 Weakness: Secondary | ICD-10-CM | POA: Diagnosis not present

## 2021-11-26 DIAGNOSIS — I447 Left bundle-branch block, unspecified: Secondary | ICD-10-CM | POA: Diagnosis not present

## 2021-11-26 DIAGNOSIS — L03115 Cellulitis of right lower limb: Secondary | ICD-10-CM | POA: Diagnosis not present

## 2021-11-26 DIAGNOSIS — N3001 Acute cystitis with hematuria: Secondary | ICD-10-CM | POA: Diagnosis not present

## 2021-11-26 DIAGNOSIS — I110 Hypertensive heart disease with heart failure: Secondary | ICD-10-CM | POA: Diagnosis not present

## 2021-11-26 DIAGNOSIS — E162 Hypoglycemia, unspecified: Secondary | ICD-10-CM | POA: Diagnosis not present

## 2021-11-26 DIAGNOSIS — F4323 Adjustment disorder with mixed anxiety and depressed mood: Secondary | ICD-10-CM | POA: Diagnosis not present

## 2021-11-26 DIAGNOSIS — Z794 Long term (current) use of insulin: Secondary | ICD-10-CM | POA: Diagnosis not present

## 2021-11-26 DIAGNOSIS — E11649 Type 2 diabetes mellitus with hypoglycemia without coma: Secondary | ICD-10-CM | POA: Diagnosis not present

## 2021-11-26 DIAGNOSIS — I480 Paroxysmal atrial fibrillation: Secondary | ICD-10-CM | POA: Diagnosis not present

## 2021-11-26 DIAGNOSIS — R300 Dysuria: Secondary | ICD-10-CM | POA: Diagnosis not present

## 2021-11-26 DIAGNOSIS — L24A2 Irritant contact dermatitis due to fecal, urinary or dual incontinence: Secondary | ICD-10-CM | POA: Diagnosis not present

## 2021-11-26 DIAGNOSIS — K529 Noninfective gastroenteritis and colitis, unspecified: Secondary | ICD-10-CM | POA: Diagnosis not present

## 2021-11-26 DIAGNOSIS — E43 Unspecified severe protein-calorie malnutrition: Secondary | ICD-10-CM | POA: Diagnosis not present

## 2021-11-26 DIAGNOSIS — Z950 Presence of cardiac pacemaker: Secondary | ICD-10-CM | POA: Diagnosis not present

## 2021-11-26 DIAGNOSIS — I4891 Unspecified atrial fibrillation: Secondary | ICD-10-CM | POA: Diagnosis not present

## 2021-11-26 DIAGNOSIS — E785 Hyperlipidemia, unspecified: Secondary | ICD-10-CM | POA: Diagnosis not present

## 2021-11-26 DIAGNOSIS — F5101 Primary insomnia: Secondary | ICD-10-CM | POA: Diagnosis not present

## 2021-11-26 DIAGNOSIS — G7000 Myasthenia gravis without (acute) exacerbation: Secondary | ICD-10-CM | POA: Diagnosis not present

## 2021-11-26 DIAGNOSIS — R4182 Altered mental status, unspecified: Secondary | ICD-10-CM | POA: Diagnosis not present

## 2021-11-26 DIAGNOSIS — I495 Sick sinus syndrome: Secondary | ICD-10-CM | POA: Diagnosis not present

## 2021-11-26 DIAGNOSIS — R1312 Dysphagia, oropharyngeal phase: Secondary | ICD-10-CM | POA: Diagnosis not present

## 2021-11-26 DIAGNOSIS — Z1159 Encounter for screening for other viral diseases: Secondary | ICD-10-CM | POA: Diagnosis not present

## 2021-11-26 DIAGNOSIS — L89313 Pressure ulcer of right buttock, stage 3: Secondary | ICD-10-CM | POA: Diagnosis not present

## 2021-11-26 DIAGNOSIS — D84821 Immunodeficiency due to drugs: Secondary | ICD-10-CM | POA: Diagnosis not present

## 2021-11-26 DIAGNOSIS — R52 Pain, unspecified: Secondary | ICD-10-CM | POA: Diagnosis not present

## 2021-11-26 DIAGNOSIS — G709 Myoneural disorder, unspecified: Secondary | ICD-10-CM | POA: Diagnosis not present

## 2021-11-26 DIAGNOSIS — E1165 Type 2 diabetes mellitus with hyperglycemia: Secondary | ICD-10-CM | POA: Diagnosis not present

## 2021-11-26 DIAGNOSIS — N3000 Acute cystitis without hematuria: Secondary | ICD-10-CM | POA: Diagnosis not present

## 2021-11-26 DIAGNOSIS — L89153 Pressure ulcer of sacral region, stage 3: Secondary | ICD-10-CM | POA: Diagnosis not present

## 2021-11-26 DIAGNOSIS — G9341 Metabolic encephalopathy: Secondary | ICD-10-CM | POA: Diagnosis not present

## 2021-11-26 DIAGNOSIS — U071 COVID-19: Secondary | ICD-10-CM | POA: Diagnosis not present

## 2021-11-26 DIAGNOSIS — R4781 Slurred speech: Secondary | ICD-10-CM | POA: Diagnosis not present

## 2021-11-26 DIAGNOSIS — T380X5A Adverse effect of glucocorticoids and synthetic analogues, initial encounter: Secondary | ICD-10-CM | POA: Diagnosis not present

## 2021-11-26 DIAGNOSIS — J9601 Acute respiratory failure with hypoxia: Secondary | ICD-10-CM | POA: Diagnosis not present

## 2021-11-26 DIAGNOSIS — I5022 Chronic systolic (congestive) heart failure: Secondary | ICD-10-CM | POA: Diagnosis not present

## 2021-11-26 DIAGNOSIS — I509 Heart failure, unspecified: Secondary | ICD-10-CM | POA: Diagnosis not present

## 2021-11-26 DIAGNOSIS — Z1621 Resistance to vancomycin: Secondary | ICD-10-CM | POA: Diagnosis not present

## 2021-11-26 DIAGNOSIS — R293 Abnormal posture: Secondary | ICD-10-CM | POA: Diagnosis not present

## 2021-11-26 DIAGNOSIS — E876 Hypokalemia: Secondary | ICD-10-CM | POA: Diagnosis not present

## 2021-11-26 DIAGNOSIS — Z2239 Carrier of other specified bacterial diseases: Secondary | ICD-10-CM | POA: Diagnosis not present

## 2021-11-26 DIAGNOSIS — N390 Urinary tract infection, site not specified: Secondary | ICD-10-CM | POA: Diagnosis not present

## 2021-11-26 DIAGNOSIS — E119 Type 2 diabetes mellitus without complications: Secondary | ICD-10-CM | POA: Diagnosis not present

## 2021-11-26 DIAGNOSIS — R6521 Severe sepsis with septic shock: Secondary | ICD-10-CM | POA: Diagnosis not present

## 2021-11-26 DIAGNOSIS — M6281 Muscle weakness (generalized): Secondary | ICD-10-CM | POA: Diagnosis not present

## 2021-11-26 DIAGNOSIS — R739 Hyperglycemia, unspecified: Secondary | ICD-10-CM | POA: Diagnosis not present

## 2021-11-26 DIAGNOSIS — J9811 Atelectasis: Secondary | ICD-10-CM | POA: Diagnosis not present

## 2021-11-26 DIAGNOSIS — R609 Edema, unspecified: Secondary | ICD-10-CM | POA: Diagnosis not present

## 2021-11-26 DIAGNOSIS — I10 Essential (primary) hypertension: Secondary | ICD-10-CM | POA: Diagnosis not present

## 2021-11-26 DIAGNOSIS — E2740 Unspecified adrenocortical insufficiency: Secondary | ICD-10-CM | POA: Diagnosis not present

## 2021-11-26 DIAGNOSIS — M810 Age-related osteoporosis without current pathological fracture: Secondary | ICD-10-CM | POA: Diagnosis not present

## 2021-11-26 DIAGNOSIS — F419 Anxiety disorder, unspecified: Secondary | ICD-10-CM | POA: Diagnosis not present

## 2021-11-26 DIAGNOSIS — I482 Chronic atrial fibrillation, unspecified: Secondary | ICD-10-CM | POA: Diagnosis not present

## 2021-11-26 DIAGNOSIS — R571 Hypovolemic shock: Secondary | ICD-10-CM | POA: Diagnosis not present

## 2021-11-26 DIAGNOSIS — Z7901 Long term (current) use of anticoagulants: Secondary | ICD-10-CM | POA: Diagnosis not present

## 2021-11-26 DIAGNOSIS — A419 Sepsis, unspecified organism: Secondary | ICD-10-CM | POA: Diagnosis not present

## 2021-11-26 DIAGNOSIS — L89892 Pressure ulcer of other site, stage 2: Secondary | ICD-10-CM | POA: Diagnosis not present

## 2021-11-28 DIAGNOSIS — L24A2 Irritant contact dermatitis due to fecal, urinary or dual incontinence: Secondary | ICD-10-CM | POA: Diagnosis not present

## 2021-11-28 DIAGNOSIS — L89153 Pressure ulcer of sacral region, stage 3: Secondary | ICD-10-CM | POA: Diagnosis not present

## 2021-11-28 DIAGNOSIS — R52 Pain, unspecified: Secondary | ICD-10-CM | POA: Diagnosis not present

## 2021-11-28 DIAGNOSIS — I10 Essential (primary) hypertension: Secondary | ICD-10-CM | POA: Diagnosis not present

## 2021-11-28 DIAGNOSIS — L89323 Pressure ulcer of left buttock, stage 3: Secondary | ICD-10-CM | POA: Diagnosis not present

## 2021-11-28 DIAGNOSIS — G7000 Myasthenia gravis without (acute) exacerbation: Secondary | ICD-10-CM | POA: Diagnosis not present

## 2021-11-28 DIAGNOSIS — L89313 Pressure ulcer of right buttock, stage 3: Secondary | ICD-10-CM | POA: Diagnosis not present

## 2021-11-28 DIAGNOSIS — M6281 Muscle weakness (generalized): Secondary | ICD-10-CM | POA: Diagnosis not present

## 2021-11-29 DIAGNOSIS — L89153 Pressure ulcer of sacral region, stage 3: Secondary | ICD-10-CM | POA: Diagnosis not present

## 2021-11-29 DIAGNOSIS — F4323 Adjustment disorder with mixed anxiety and depressed mood: Secondary | ICD-10-CM | POA: Diagnosis not present

## 2021-11-29 DIAGNOSIS — F5101 Primary insomnia: Secondary | ICD-10-CM | POA: Diagnosis not present

## 2021-11-29 DIAGNOSIS — R52 Pain, unspecified: Secondary | ICD-10-CM | POA: Diagnosis not present

## 2021-12-02 DIAGNOSIS — R739 Hyperglycemia, unspecified: Secondary | ICD-10-CM | POA: Diagnosis not present

## 2021-12-03 DIAGNOSIS — I509 Heart failure, unspecified: Secondary | ICD-10-CM | POA: Diagnosis not present

## 2021-12-03 DIAGNOSIS — L89153 Pressure ulcer of sacral region, stage 3: Secondary | ICD-10-CM | POA: Diagnosis not present

## 2021-12-03 DIAGNOSIS — M6281 Muscle weakness (generalized): Secondary | ICD-10-CM | POA: Diagnosis not present

## 2021-12-03 DIAGNOSIS — I10 Essential (primary) hypertension: Secondary | ICD-10-CM | POA: Diagnosis not present

## 2021-12-03 DIAGNOSIS — E119 Type 2 diabetes mellitus without complications: Secondary | ICD-10-CM | POA: Diagnosis not present

## 2021-12-03 DIAGNOSIS — G7000 Myasthenia gravis without (acute) exacerbation: Secondary | ICD-10-CM | POA: Diagnosis not present

## 2021-12-04 DIAGNOSIS — R2689 Other abnormalities of gait and mobility: Secondary | ICD-10-CM | POA: Diagnosis not present

## 2021-12-04 DIAGNOSIS — R6521 Severe sepsis with septic shock: Secondary | ICD-10-CM | POA: Diagnosis not present

## 2021-12-04 DIAGNOSIS — E1165 Type 2 diabetes mellitus with hyperglycemia: Secondary | ICD-10-CM | POA: Diagnosis not present

## 2021-12-04 DIAGNOSIS — T8389XA Other specified complication of genitourinary prosthetic devices, implants and grafts, initial encounter: Secondary | ICD-10-CM | POA: Diagnosis not present

## 2021-12-04 DIAGNOSIS — R52 Pain, unspecified: Secondary | ICD-10-CM | POA: Diagnosis not present

## 2021-12-04 DIAGNOSIS — T380X5A Adverse effect of glucocorticoids and synthetic analogues, initial encounter: Secondary | ICD-10-CM | POA: Diagnosis not present

## 2021-12-04 DIAGNOSIS — L89153 Pressure ulcer of sacral region, stage 3: Secondary | ICD-10-CM | POA: Diagnosis not present

## 2021-12-04 DIAGNOSIS — I952 Hypotension due to drugs: Secondary | ICD-10-CM | POA: Diagnosis not present

## 2021-12-04 DIAGNOSIS — G709 Myoneural disorder, unspecified: Secondary | ICD-10-CM | POA: Diagnosis not present

## 2021-12-04 DIAGNOSIS — A419 Sepsis, unspecified organism: Secondary | ICD-10-CM | POA: Diagnosis not present

## 2021-12-04 DIAGNOSIS — N390 Urinary tract infection, site not specified: Secondary | ICD-10-CM | POA: Diagnosis not present

## 2021-12-04 DIAGNOSIS — F419 Anxiety disorder, unspecified: Secondary | ICD-10-CM | POA: Diagnosis not present

## 2021-12-04 DIAGNOSIS — E119 Type 2 diabetes mellitus without complications: Secondary | ICD-10-CM | POA: Diagnosis not present

## 2021-12-04 DIAGNOSIS — I110 Hypertensive heart disease with heart failure: Secondary | ICD-10-CM | POA: Diagnosis not present

## 2021-12-04 DIAGNOSIS — I951 Orthostatic hypotension: Secondary | ICD-10-CM | POA: Diagnosis not present

## 2021-12-04 DIAGNOSIS — I471 Supraventricular tachycardia: Secondary | ICD-10-CM | POA: Diagnosis not present

## 2021-12-04 DIAGNOSIS — L89312 Pressure ulcer of right buttock, stage 2: Secondary | ICD-10-CM | POA: Diagnosis not present

## 2021-12-04 DIAGNOSIS — I4891 Unspecified atrial fibrillation: Secondary | ICD-10-CM | POA: Diagnosis not present

## 2021-12-04 DIAGNOSIS — T85898A Other specified complication of other internal prosthetic devices, implants and grafts, initial encounter: Secondary | ICD-10-CM | POA: Diagnosis not present

## 2021-12-04 DIAGNOSIS — L89896 Pressure-induced deep tissue damage of other site: Secondary | ICD-10-CM | POA: Diagnosis not present

## 2021-12-04 DIAGNOSIS — R6511 Systemic inflammatory response syndrome (SIRS) of non-infectious origin with acute organ dysfunction: Secondary | ICD-10-CM | POA: Diagnosis not present

## 2021-12-04 DIAGNOSIS — Z7901 Long term (current) use of anticoagulants: Secondary | ICD-10-CM | POA: Diagnosis not present

## 2021-12-04 DIAGNOSIS — U071 COVID-19: Secondary | ICD-10-CM | POA: Diagnosis not present

## 2021-12-04 DIAGNOSIS — E876 Hypokalemia: Secondary | ICD-10-CM | POA: Diagnosis not present

## 2021-12-04 DIAGNOSIS — I447 Left bundle-branch block, unspecified: Secondary | ICD-10-CM | POA: Diagnosis not present

## 2021-12-04 DIAGNOSIS — R4182 Altered mental status, unspecified: Secondary | ICD-10-CM | POA: Diagnosis not present

## 2021-12-04 DIAGNOSIS — L89322 Pressure ulcer of left buttock, stage 2: Secondary | ICD-10-CM | POA: Diagnosis not present

## 2021-12-04 DIAGNOSIS — K529 Noninfective gastroenteritis and colitis, unspecified: Secondary | ICD-10-CM | POA: Diagnosis not present

## 2021-12-04 DIAGNOSIS — E440 Moderate protein-calorie malnutrition: Secondary | ICD-10-CM | POA: Diagnosis not present

## 2021-12-04 DIAGNOSIS — G9341 Metabolic encephalopathy: Secondary | ICD-10-CM | POA: Diagnosis not present

## 2021-12-04 DIAGNOSIS — G7001 Myasthenia gravis with (acute) exacerbation: Secondary | ICD-10-CM | POA: Diagnosis not present

## 2021-12-04 DIAGNOSIS — L8990 Pressure ulcer of unspecified site, unspecified stage: Secondary | ICD-10-CM | POA: Diagnosis not present

## 2021-12-04 DIAGNOSIS — I5020 Unspecified systolic (congestive) heart failure: Secondary | ICD-10-CM | POA: Diagnosis not present

## 2021-12-04 DIAGNOSIS — I480 Paroxysmal atrial fibrillation: Secondary | ICD-10-CM | POA: Diagnosis not present

## 2021-12-04 DIAGNOSIS — I493 Ventricular premature depolarization: Secondary | ICD-10-CM | POA: Diagnosis not present

## 2021-12-04 DIAGNOSIS — G7000 Myasthenia gravis without (acute) exacerbation: Secondary | ICD-10-CM | POA: Diagnosis not present

## 2021-12-04 DIAGNOSIS — Z45018 Encounter for adjustment and management of other part of cardiac pacemaker: Secondary | ICD-10-CM | POA: Diagnosis not present

## 2021-12-04 DIAGNOSIS — M6281 Muscle weakness (generalized): Secondary | ICD-10-CM | POA: Diagnosis not present

## 2021-12-04 DIAGNOSIS — E162 Hypoglycemia, unspecified: Secondary | ICD-10-CM | POA: Diagnosis not present

## 2021-12-04 DIAGNOSIS — I482 Chronic atrial fibrillation, unspecified: Secondary | ICD-10-CM | POA: Diagnosis not present

## 2021-12-04 DIAGNOSIS — M8580 Other specified disorders of bone density and structure, unspecified site: Secondary | ICD-10-CM | POA: Diagnosis not present

## 2021-12-04 DIAGNOSIS — N3000 Acute cystitis without hematuria: Secondary | ICD-10-CM | POA: Diagnosis not present

## 2021-12-04 DIAGNOSIS — R9431 Abnormal electrocardiogram [ECG] [EKG]: Secondary | ICD-10-CM | POA: Diagnosis not present

## 2021-12-04 DIAGNOSIS — R531 Weakness: Secondary | ICD-10-CM | POA: Diagnosis not present

## 2021-12-04 DIAGNOSIS — Z794 Long term (current) use of insulin: Secondary | ICD-10-CM | POA: Diagnosis not present

## 2021-12-04 DIAGNOSIS — E270 Other adrenocortical overactivity: Secondary | ICD-10-CM | POA: Diagnosis not present

## 2021-12-04 DIAGNOSIS — R609 Edema, unspecified: Secondary | ICD-10-CM | POA: Diagnosis not present

## 2021-12-04 DIAGNOSIS — I5022 Chronic systolic (congestive) heart failure: Secondary | ICD-10-CM | POA: Diagnosis not present

## 2021-12-04 DIAGNOSIS — I10 Essential (primary) hypertension: Secondary | ICD-10-CM | POA: Diagnosis not present

## 2021-12-04 DIAGNOSIS — E43 Unspecified severe protein-calorie malnutrition: Secondary | ICD-10-CM | POA: Diagnosis not present

## 2021-12-04 DIAGNOSIS — I959 Hypotension, unspecified: Secondary | ICD-10-CM | POA: Diagnosis not present

## 2021-12-04 DIAGNOSIS — R4781 Slurred speech: Secondary | ICD-10-CM | POA: Diagnosis not present

## 2021-12-04 DIAGNOSIS — R000 Tachycardia, unspecified: Secondary | ICD-10-CM | POA: Diagnosis not present

## 2021-12-04 DIAGNOSIS — E1142 Type 2 diabetes mellitus with diabetic polyneuropathy: Secondary | ICD-10-CM | POA: Diagnosis not present

## 2021-12-04 DIAGNOSIS — E11649 Type 2 diabetes mellitus with hypoglycemia without coma: Secondary | ICD-10-CM | POA: Diagnosis not present

## 2021-12-04 DIAGNOSIS — Z7952 Long term (current) use of systemic steroids: Secondary | ICD-10-CM | POA: Diagnosis not present

## 2021-12-04 DIAGNOSIS — Z66 Do not resuscitate: Secondary | ICD-10-CM | POA: Diagnosis not present

## 2021-12-04 DIAGNOSIS — N3001 Acute cystitis with hematuria: Secondary | ICD-10-CM | POA: Diagnosis not present

## 2021-12-04 DIAGNOSIS — L89892 Pressure ulcer of other site, stage 2: Secondary | ICD-10-CM | POA: Diagnosis not present

## 2021-12-04 DIAGNOSIS — L89212 Pressure ulcer of right hip, stage 2: Secondary | ICD-10-CM | POA: Diagnosis not present

## 2021-12-04 DIAGNOSIS — I495 Sick sinus syndrome: Secondary | ICD-10-CM | POA: Diagnosis not present

## 2021-12-04 DIAGNOSIS — R109 Unspecified abdominal pain: Secondary | ICD-10-CM | POA: Diagnosis not present

## 2021-12-04 DIAGNOSIS — Z6821 Body mass index (BMI) 21.0-21.9, adult: Secondary | ICD-10-CM | POA: Diagnosis not present

## 2021-12-04 DIAGNOSIS — I491 Atrial premature depolarization: Secondary | ICD-10-CM | POA: Diagnosis not present

## 2021-12-04 DIAGNOSIS — I509 Heart failure, unspecified: Secondary | ICD-10-CM | POA: Diagnosis not present

## 2021-12-04 DIAGNOSIS — K219 Gastro-esophageal reflux disease without esophagitis: Secondary | ICD-10-CM | POA: Diagnosis not present

## 2021-12-04 DIAGNOSIS — J9811 Atelectasis: Secondary | ICD-10-CM | POA: Diagnosis not present

## 2021-12-04 DIAGNOSIS — E2740 Unspecified adrenocortical insufficiency: Secondary | ICD-10-CM | POA: Diagnosis not present

## 2021-12-04 DIAGNOSIS — I499 Cardiac arrhythmia, unspecified: Secondary | ICD-10-CM | POA: Diagnosis not present

## 2021-12-04 DIAGNOSIS — E785 Hyperlipidemia, unspecified: Secondary | ICD-10-CM | POA: Diagnosis not present

## 2021-12-04 DIAGNOSIS — E8809 Other disorders of plasma-protein metabolism, not elsewhere classified: Secondary | ICD-10-CM | POA: Diagnosis not present

## 2021-12-04 DIAGNOSIS — Z950 Presence of cardiac pacemaker: Secondary | ICD-10-CM | POA: Diagnosis not present

## 2021-12-05 DIAGNOSIS — I482 Chronic atrial fibrillation, unspecified: Secondary | ICD-10-CM | POA: Diagnosis not present

## 2021-12-05 DIAGNOSIS — G7000 Myasthenia gravis without (acute) exacerbation: Secondary | ICD-10-CM | POA: Diagnosis not present

## 2021-12-05 DIAGNOSIS — R6521 Severe sepsis with septic shock: Secondary | ICD-10-CM | POA: Diagnosis not present

## 2021-12-05 DIAGNOSIS — N390 Urinary tract infection, site not specified: Secondary | ICD-10-CM | POA: Diagnosis not present

## 2021-12-05 DIAGNOSIS — I10 Essential (primary) hypertension: Secondary | ICD-10-CM | POA: Diagnosis not present

## 2021-12-05 DIAGNOSIS — K219 Gastro-esophageal reflux disease without esophagitis: Secondary | ICD-10-CM | POA: Diagnosis not present

## 2021-12-05 DIAGNOSIS — E119 Type 2 diabetes mellitus without complications: Secondary | ICD-10-CM | POA: Diagnosis not present

## 2021-12-05 DIAGNOSIS — R52 Pain, unspecified: Secondary | ICD-10-CM | POA: Diagnosis not present

## 2021-12-05 DIAGNOSIS — I509 Heart failure, unspecified: Secondary | ICD-10-CM | POA: Diagnosis not present

## 2021-12-05 DIAGNOSIS — R609 Edema, unspecified: Secondary | ICD-10-CM | POA: Diagnosis not present

## 2021-12-05 DIAGNOSIS — M8580 Other specified disorders of bone density and structure, unspecified site: Secondary | ICD-10-CM | POA: Diagnosis not present

## 2021-12-05 DIAGNOSIS — M6281 Muscle weakness (generalized): Secondary | ICD-10-CM | POA: Diagnosis not present

## 2021-12-05 DIAGNOSIS — G709 Myoneural disorder, unspecified: Secondary | ICD-10-CM | POA: Diagnosis not present

## 2021-12-05 DIAGNOSIS — I951 Orthostatic hypotension: Secondary | ICD-10-CM | POA: Diagnosis not present

## 2021-12-05 DIAGNOSIS — A419 Sepsis, unspecified organism: Secondary | ICD-10-CM | POA: Diagnosis not present

## 2021-12-06 DIAGNOSIS — I491 Atrial premature depolarization: Secondary | ICD-10-CM | POA: Diagnosis not present

## 2021-12-06 DIAGNOSIS — E162 Hypoglycemia, unspecified: Secondary | ICD-10-CM | POA: Diagnosis not present

## 2021-12-06 DIAGNOSIS — Z7952 Long term (current) use of systemic steroids: Secondary | ICD-10-CM | POA: Diagnosis not present

## 2021-12-06 DIAGNOSIS — N390 Urinary tract infection, site not specified: Secondary | ICD-10-CM | POA: Diagnosis not present

## 2021-12-06 DIAGNOSIS — R4182 Altered mental status, unspecified: Secondary | ICD-10-CM | POA: Diagnosis not present

## 2021-12-06 DIAGNOSIS — R6521 Severe sepsis with septic shock: Secondary | ICD-10-CM | POA: Diagnosis not present

## 2021-12-06 DIAGNOSIS — R9431 Abnormal electrocardiogram [ECG] [EKG]: Secondary | ICD-10-CM | POA: Diagnosis not present

## 2021-12-06 DIAGNOSIS — R531 Weakness: Secondary | ICD-10-CM | POA: Diagnosis not present

## 2021-12-06 DIAGNOSIS — Z45018 Encounter for adjustment and management of other part of cardiac pacemaker: Secondary | ICD-10-CM | POA: Diagnosis not present

## 2021-12-06 DIAGNOSIS — A419 Sepsis, unspecified organism: Secondary | ICD-10-CM | POA: Diagnosis not present

## 2021-12-07 DIAGNOSIS — R000 Tachycardia, unspecified: Secondary | ICD-10-CM | POA: Diagnosis not present

## 2021-12-07 DIAGNOSIS — N390 Urinary tract infection, site not specified: Secondary | ICD-10-CM | POA: Diagnosis not present

## 2021-12-07 DIAGNOSIS — A419 Sepsis, unspecified organism: Secondary | ICD-10-CM | POA: Diagnosis not present

## 2021-12-07 DIAGNOSIS — R6521 Severe sepsis with septic shock: Secondary | ICD-10-CM | POA: Diagnosis not present

## 2021-12-08 DIAGNOSIS — R4182 Altered mental status, unspecified: Secondary | ICD-10-CM | POA: Diagnosis not present

## 2021-12-08 DIAGNOSIS — E162 Hypoglycemia, unspecified: Secondary | ICD-10-CM | POA: Diagnosis not present

## 2021-12-08 DIAGNOSIS — Z794 Long term (current) use of insulin: Secondary | ICD-10-CM | POA: Diagnosis not present

## 2021-12-08 DIAGNOSIS — N390 Urinary tract infection, site not specified: Secondary | ICD-10-CM | POA: Diagnosis not present

## 2021-12-08 DIAGNOSIS — I4891 Unspecified atrial fibrillation: Secondary | ICD-10-CM | POA: Diagnosis not present

## 2021-12-08 DIAGNOSIS — L89153 Pressure ulcer of sacral region, stage 3: Secondary | ICD-10-CM | POA: Diagnosis not present

## 2021-12-08 DIAGNOSIS — G7000 Myasthenia gravis without (acute) exacerbation: Secondary | ICD-10-CM | POA: Diagnosis not present

## 2021-12-08 DIAGNOSIS — E876 Hypokalemia: Secondary | ICD-10-CM | POA: Diagnosis not present

## 2021-12-08 DIAGNOSIS — Z7952 Long term (current) use of systemic steroids: Secondary | ICD-10-CM | POA: Diagnosis not present

## 2021-12-08 DIAGNOSIS — R6521 Severe sepsis with septic shock: Secondary | ICD-10-CM | POA: Diagnosis not present

## 2021-12-08 DIAGNOSIS — E1142 Type 2 diabetes mellitus with diabetic polyneuropathy: Secondary | ICD-10-CM | POA: Diagnosis not present

## 2021-12-08 DIAGNOSIS — L89892 Pressure ulcer of other site, stage 2: Secondary | ICD-10-CM | POA: Diagnosis not present

## 2021-12-08 DIAGNOSIS — R531 Weakness: Secondary | ICD-10-CM | POA: Diagnosis not present

## 2021-12-08 DIAGNOSIS — A419 Sepsis, unspecified organism: Secondary | ICD-10-CM | POA: Diagnosis not present

## 2021-12-08 DIAGNOSIS — E11649 Type 2 diabetes mellitus with hypoglycemia without coma: Secondary | ICD-10-CM | POA: Diagnosis not present

## 2021-12-09 DIAGNOSIS — E11649 Type 2 diabetes mellitus with hypoglycemia without coma: Secondary | ICD-10-CM | POA: Diagnosis not present

## 2021-12-09 DIAGNOSIS — G7000 Myasthenia gravis without (acute) exacerbation: Secondary | ICD-10-CM | POA: Diagnosis not present

## 2021-12-09 DIAGNOSIS — R4182 Altered mental status, unspecified: Secondary | ICD-10-CM | POA: Diagnosis not present

## 2021-12-09 DIAGNOSIS — N390 Urinary tract infection, site not specified: Secondary | ICD-10-CM | POA: Diagnosis not present

## 2021-12-09 DIAGNOSIS — L89892 Pressure ulcer of other site, stage 2: Secondary | ICD-10-CM | POA: Diagnosis not present

## 2021-12-09 DIAGNOSIS — E119 Type 2 diabetes mellitus without complications: Secondary | ICD-10-CM | POA: Diagnosis not present

## 2021-12-09 DIAGNOSIS — E876 Hypokalemia: Secondary | ICD-10-CM | POA: Diagnosis not present

## 2021-12-09 DIAGNOSIS — R6521 Severe sepsis with septic shock: Secondary | ICD-10-CM | POA: Diagnosis not present

## 2021-12-09 DIAGNOSIS — I4891 Unspecified atrial fibrillation: Secondary | ICD-10-CM | POA: Diagnosis not present

## 2021-12-09 DIAGNOSIS — Z7952 Long term (current) use of systemic steroids: Secondary | ICD-10-CM | POA: Diagnosis not present

## 2021-12-09 DIAGNOSIS — L89153 Pressure ulcer of sacral region, stage 3: Secondary | ICD-10-CM | POA: Diagnosis not present

## 2021-12-09 DIAGNOSIS — E2740 Unspecified adrenocortical insufficiency: Secondary | ICD-10-CM | POA: Diagnosis not present

## 2021-12-09 DIAGNOSIS — Z794 Long term (current) use of insulin: Secondary | ICD-10-CM | POA: Diagnosis not present

## 2021-12-09 DIAGNOSIS — A419 Sepsis, unspecified organism: Secondary | ICD-10-CM | POA: Diagnosis not present

## 2021-12-10 DIAGNOSIS — I959 Hypotension, unspecified: Secondary | ICD-10-CM | POA: Diagnosis not present

## 2021-12-10 DIAGNOSIS — G9341 Metabolic encephalopathy: Secondary | ICD-10-CM | POA: Diagnosis not present

## 2021-12-10 DIAGNOSIS — L89312 Pressure ulcer of right buttock, stage 2: Secondary | ICD-10-CM | POA: Diagnosis not present

## 2021-12-10 DIAGNOSIS — E1165 Type 2 diabetes mellitus with hyperglycemia: Secondary | ICD-10-CM | POA: Diagnosis not present

## 2021-12-10 DIAGNOSIS — E270 Other adrenocortical overactivity: Secondary | ICD-10-CM | POA: Diagnosis not present

## 2021-12-10 DIAGNOSIS — G7000 Myasthenia gravis without (acute) exacerbation: Secondary | ICD-10-CM | POA: Diagnosis not present

## 2021-12-10 DIAGNOSIS — N390 Urinary tract infection, site not specified: Secondary | ICD-10-CM | POA: Diagnosis not present

## 2021-12-10 DIAGNOSIS — E2740 Unspecified adrenocortical insufficiency: Secondary | ICD-10-CM | POA: Diagnosis not present

## 2021-12-10 DIAGNOSIS — L89153 Pressure ulcer of sacral region, stage 3: Secondary | ICD-10-CM | POA: Diagnosis not present

## 2021-12-10 DIAGNOSIS — A419 Sepsis, unspecified organism: Secondary | ICD-10-CM | POA: Diagnosis not present

## 2021-12-10 DIAGNOSIS — E119 Type 2 diabetes mellitus without complications: Secondary | ICD-10-CM | POA: Diagnosis not present

## 2021-12-10 DIAGNOSIS — Z7952 Long term (current) use of systemic steroids: Secondary | ICD-10-CM | POA: Diagnosis not present

## 2021-12-10 DIAGNOSIS — I5020 Unspecified systolic (congestive) heart failure: Secondary | ICD-10-CM | POA: Diagnosis not present

## 2021-12-10 DIAGNOSIS — R6511 Systemic inflammatory response syndrome (SIRS) of non-infectious origin with acute organ dysfunction: Secondary | ICD-10-CM | POA: Diagnosis not present

## 2021-12-10 DIAGNOSIS — T380X5A Adverse effect of glucocorticoids and synthetic analogues, initial encounter: Secondary | ICD-10-CM | POA: Diagnosis not present

## 2021-12-10 DIAGNOSIS — Z794 Long term (current) use of insulin: Secondary | ICD-10-CM | POA: Diagnosis not present

## 2021-12-10 DIAGNOSIS — I480 Paroxysmal atrial fibrillation: Secondary | ICD-10-CM | POA: Diagnosis not present

## 2021-12-11 DIAGNOSIS — G7000 Myasthenia gravis without (acute) exacerbation: Secondary | ICD-10-CM | POA: Diagnosis not present

## 2021-12-11 DIAGNOSIS — T85898A Other specified complication of other internal prosthetic devices, implants and grafts, initial encounter: Secondary | ICD-10-CM | POA: Diagnosis not present

## 2021-12-11 DIAGNOSIS — Z794 Long term (current) use of insulin: Secondary | ICD-10-CM | POA: Diagnosis not present

## 2021-12-11 DIAGNOSIS — Z66 Do not resuscitate: Secondary | ICD-10-CM | POA: Diagnosis not present

## 2021-12-11 DIAGNOSIS — L89153 Pressure ulcer of sacral region, stage 3: Secondary | ICD-10-CM | POA: Diagnosis not present

## 2021-12-11 DIAGNOSIS — L89312 Pressure ulcer of right buttock, stage 2: Secondary | ICD-10-CM | POA: Diagnosis not present

## 2021-12-11 DIAGNOSIS — I5020 Unspecified systolic (congestive) heart failure: Secondary | ICD-10-CM | POA: Diagnosis not present

## 2021-12-11 DIAGNOSIS — L8990 Pressure ulcer of unspecified site, unspecified stage: Secondary | ICD-10-CM | POA: Diagnosis not present

## 2021-12-11 DIAGNOSIS — E440 Moderate protein-calorie malnutrition: Secondary | ICD-10-CM | POA: Diagnosis not present

## 2021-12-11 DIAGNOSIS — I10 Essential (primary) hypertension: Secondary | ICD-10-CM | POA: Diagnosis not present

## 2021-12-11 DIAGNOSIS — R9431 Abnormal electrocardiogram [ECG] [EKG]: Secondary | ICD-10-CM | POA: Diagnosis not present

## 2021-12-11 DIAGNOSIS — L89322 Pressure ulcer of left buttock, stage 2: Secondary | ICD-10-CM | POA: Diagnosis not present

## 2021-12-11 DIAGNOSIS — F419 Anxiety disorder, unspecified: Secondary | ICD-10-CM | POA: Diagnosis not present

## 2021-12-11 DIAGNOSIS — E119 Type 2 diabetes mellitus without complications: Secondary | ICD-10-CM | POA: Diagnosis not present

## 2021-12-11 DIAGNOSIS — I4891 Unspecified atrial fibrillation: Secondary | ICD-10-CM | POA: Diagnosis not present

## 2021-12-12 DIAGNOSIS — Z45018 Encounter for adjustment and management of other part of cardiac pacemaker: Secondary | ICD-10-CM | POA: Diagnosis not present

## 2021-12-12 DIAGNOSIS — I491 Atrial premature depolarization: Secondary | ICD-10-CM | POA: Diagnosis not present

## 2021-12-12 DIAGNOSIS — I471 Supraventricular tachycardia: Secondary | ICD-10-CM | POA: Diagnosis not present

## 2021-12-12 DIAGNOSIS — R9431 Abnormal electrocardiogram [ECG] [EKG]: Secondary | ICD-10-CM | POA: Diagnosis not present

## 2021-12-15 DIAGNOSIS — T380X5A Adverse effect of glucocorticoids and synthetic analogues, initial encounter: Secondary | ICD-10-CM | POA: Diagnosis not present

## 2021-12-15 DIAGNOSIS — E1165 Type 2 diabetes mellitus with hyperglycemia: Secondary | ICD-10-CM | POA: Diagnosis not present

## 2021-12-16 DIAGNOSIS — I272 Pulmonary hypertension, unspecified: Secondary | ICD-10-CM | POA: Diagnosis not present

## 2021-12-16 DIAGNOSIS — M25551 Pain in right hip: Secondary | ICD-10-CM | POA: Diagnosis not present

## 2021-12-16 DIAGNOSIS — L259 Unspecified contact dermatitis, unspecified cause: Secondary | ICD-10-CM | POA: Diagnosis not present

## 2021-12-16 DIAGNOSIS — L89896 Pressure-induced deep tissue damage of other site: Secondary | ICD-10-CM | POA: Diagnosis not present

## 2021-12-16 DIAGNOSIS — R079 Chest pain, unspecified: Secondary | ICD-10-CM | POA: Diagnosis not present

## 2021-12-16 DIAGNOSIS — Z8619 Personal history of other infectious and parasitic diseases: Secondary | ICD-10-CM | POA: Diagnosis not present

## 2021-12-16 DIAGNOSIS — G8311 Monoplegia of lower limb affecting right dominant side: Secondary | ICD-10-CM | POA: Diagnosis not present

## 2021-12-16 DIAGNOSIS — E119 Type 2 diabetes mellitus without complications: Secondary | ICD-10-CM | POA: Diagnosis not present

## 2021-12-16 DIAGNOSIS — R2689 Other abnormalities of gait and mobility: Secondary | ICD-10-CM | POA: Diagnosis not present

## 2021-12-16 DIAGNOSIS — Z79899 Other long term (current) drug therapy: Secondary | ICD-10-CM | POA: Diagnosis not present

## 2021-12-16 DIAGNOSIS — Z8781 Personal history of (healed) traumatic fracture: Secondary | ICD-10-CM | POA: Diagnosis not present

## 2021-12-16 DIAGNOSIS — L89153 Pressure ulcer of sacral region, stage 3: Secondary | ICD-10-CM | POA: Diagnosis not present

## 2021-12-16 DIAGNOSIS — I959 Hypotension, unspecified: Secondary | ICD-10-CM | POA: Diagnosis not present

## 2021-12-16 DIAGNOSIS — E11649 Type 2 diabetes mellitus with hypoglycemia without coma: Secondary | ICD-10-CM | POA: Diagnosis not present

## 2021-12-16 DIAGNOSIS — I5020 Unspecified systolic (congestive) heart failure: Secondary | ICD-10-CM | POA: Diagnosis not present

## 2021-12-16 DIAGNOSIS — Z7952 Long term (current) use of systemic steroids: Secondary | ICD-10-CM | POA: Diagnosis not present

## 2021-12-16 DIAGNOSIS — Z6820 Body mass index (BMI) 20.0-20.9, adult: Secondary | ICD-10-CM | POA: Diagnosis not present

## 2021-12-16 DIAGNOSIS — T8389XA Other specified complication of genitourinary prosthetic devices, implants and grafts, initial encounter: Secondary | ICD-10-CM | POA: Diagnosis not present

## 2021-12-16 DIAGNOSIS — I499 Cardiac arrhythmia, unspecified: Secondary | ICD-10-CM | POA: Diagnosis not present

## 2021-12-16 DIAGNOSIS — I952 Hypotension due to drugs: Secondary | ICD-10-CM | POA: Diagnosis not present

## 2021-12-16 DIAGNOSIS — Z794 Long term (current) use of insulin: Secondary | ICD-10-CM | POA: Diagnosis not present

## 2021-12-16 DIAGNOSIS — H02409 Unspecified ptosis of unspecified eyelid: Secondary | ICD-10-CM | POA: Diagnosis not present

## 2021-12-16 DIAGNOSIS — L89212 Pressure ulcer of right hip, stage 2: Secondary | ICD-10-CM | POA: Diagnosis not present

## 2021-12-16 DIAGNOSIS — Z593 Problems related to living in residential institution: Secondary | ICD-10-CM | POA: Diagnosis not present

## 2021-12-16 DIAGNOSIS — G8314 Monoplegia of lower limb affecting left nondominant side: Secondary | ICD-10-CM | POA: Diagnosis not present

## 2021-12-16 DIAGNOSIS — Z96641 Presence of right artificial hip joint: Secondary | ICD-10-CM | POA: Diagnosis not present

## 2021-12-16 DIAGNOSIS — I10 Essential (primary) hypertension: Secondary | ICD-10-CM | POA: Diagnosis not present

## 2021-12-16 DIAGNOSIS — I110 Hypertensive heart disease with heart failure: Secondary | ICD-10-CM | POA: Diagnosis not present

## 2021-12-16 DIAGNOSIS — M25561 Pain in right knee: Secondary | ICD-10-CM | POA: Diagnosis not present

## 2021-12-16 DIAGNOSIS — G7000 Myasthenia gravis without (acute) exacerbation: Secondary | ICD-10-CM | POA: Diagnosis not present

## 2021-12-16 DIAGNOSIS — E43 Unspecified severe protein-calorie malnutrition: Secondary | ICD-10-CM | POA: Diagnosis not present

## 2021-12-16 DIAGNOSIS — H532 Diplopia: Secondary | ICD-10-CM | POA: Diagnosis not present

## 2021-12-16 DIAGNOSIS — R891 Abnormal level of hormones in specimens from other organs, systems and tissues: Secondary | ICD-10-CM | POA: Diagnosis not present

## 2021-12-16 DIAGNOSIS — Z950 Presence of cardiac pacemaker: Secondary | ICD-10-CM | POA: Diagnosis not present

## 2021-12-16 DIAGNOSIS — R748 Abnormal levels of other serum enzymes: Secondary | ICD-10-CM | POA: Diagnosis not present

## 2021-12-16 DIAGNOSIS — K529 Noninfective gastroenteritis and colitis, unspecified: Secondary | ICD-10-CM | POA: Diagnosis not present

## 2021-12-16 DIAGNOSIS — I493 Ventricular premature depolarization: Secondary | ICD-10-CM | POA: Diagnosis not present

## 2021-12-16 DIAGNOSIS — L8990 Pressure ulcer of unspecified site, unspecified stage: Secondary | ICD-10-CM | POA: Diagnosis not present

## 2021-12-16 DIAGNOSIS — F0390 Unspecified dementia without behavioral disturbance: Secondary | ICD-10-CM | POA: Diagnosis not present

## 2021-12-16 DIAGNOSIS — G7281 Critical illness myopathy: Secondary | ICD-10-CM | POA: Diagnosis not present

## 2021-12-16 DIAGNOSIS — F419 Anxiety disorder, unspecified: Secondary | ICD-10-CM | POA: Diagnosis not present

## 2021-12-16 DIAGNOSIS — Z6821 Body mass index (BMI) 21.0-21.9, adult: Secondary | ICD-10-CM | POA: Diagnosis not present

## 2021-12-16 DIAGNOSIS — I491 Atrial premature depolarization: Secondary | ICD-10-CM | POA: Diagnosis not present

## 2021-12-16 DIAGNOSIS — D801 Nonfamilial hypogammaglobulinemia: Secondary | ICD-10-CM | POA: Diagnosis not present

## 2021-12-16 DIAGNOSIS — G7001 Myasthenia gravis with (acute) exacerbation: Secondary | ICD-10-CM | POA: Diagnosis not present

## 2021-12-16 DIAGNOSIS — E785 Hyperlipidemia, unspecified: Secondary | ICD-10-CM | POA: Diagnosis not present

## 2021-12-16 DIAGNOSIS — I4891 Unspecified atrial fibrillation: Secondary | ICD-10-CM | POA: Diagnosis not present

## 2021-12-16 DIAGNOSIS — Z7689 Persons encountering health services in other specified circumstances: Secondary | ICD-10-CM | POA: Diagnosis not present

## 2021-12-17 DIAGNOSIS — L89153 Pressure ulcer of sacral region, stage 3: Secondary | ICD-10-CM | POA: Diagnosis not present

## 2021-12-17 DIAGNOSIS — L259 Unspecified contact dermatitis, unspecified cause: Secondary | ICD-10-CM | POA: Diagnosis not present

## 2021-12-18 DIAGNOSIS — R891 Abnormal level of hormones in specimens from other organs, systems and tissues: Secondary | ICD-10-CM | POA: Diagnosis not present

## 2021-12-18 DIAGNOSIS — Z79899 Other long term (current) drug therapy: Secondary | ICD-10-CM | POA: Diagnosis not present

## 2021-12-18 DIAGNOSIS — G7000 Myasthenia gravis without (acute) exacerbation: Secondary | ICD-10-CM | POA: Diagnosis not present

## 2021-12-18 DIAGNOSIS — G8314 Monoplegia of lower limb affecting left nondominant side: Secondary | ICD-10-CM | POA: Diagnosis not present

## 2021-12-18 DIAGNOSIS — G8311 Monoplegia of lower limb affecting right dominant side: Secondary | ICD-10-CM | POA: Diagnosis not present

## 2021-12-18 DIAGNOSIS — Z593 Problems related to living in residential institution: Secondary | ICD-10-CM | POA: Diagnosis not present

## 2021-12-18 DIAGNOSIS — Z8619 Personal history of other infectious and parasitic diseases: Secondary | ICD-10-CM | POA: Diagnosis not present

## 2021-12-24 DIAGNOSIS — L259 Unspecified contact dermatitis, unspecified cause: Secondary | ICD-10-CM | POA: Diagnosis not present

## 2021-12-24 DIAGNOSIS — L89153 Pressure ulcer of sacral region, stage 3: Secondary | ICD-10-CM | POA: Diagnosis not present

## 2021-12-31 DIAGNOSIS — Z7689 Persons encountering health services in other specified circumstances: Secondary | ICD-10-CM | POA: Diagnosis not present

## 2021-12-31 DIAGNOSIS — I4891 Unspecified atrial fibrillation: Secondary | ICD-10-CM | POA: Diagnosis not present

## 2021-12-31 DIAGNOSIS — R079 Chest pain, unspecified: Secondary | ICD-10-CM | POA: Diagnosis not present

## 2021-12-31 DIAGNOSIS — E785 Hyperlipidemia, unspecified: Secondary | ICD-10-CM | POA: Diagnosis not present

## 2021-12-31 DIAGNOSIS — Z950 Presence of cardiac pacemaker: Secondary | ICD-10-CM | POA: Diagnosis not present

## 2021-12-31 DIAGNOSIS — I10 Essential (primary) hypertension: Secondary | ICD-10-CM | POA: Diagnosis not present

## 2021-12-31 DIAGNOSIS — I272 Pulmonary hypertension, unspecified: Secondary | ICD-10-CM | POA: Diagnosis not present

## 2021-12-31 DIAGNOSIS — Z6820 Body mass index (BMI) 20.0-20.9, adult: Secondary | ICD-10-CM | POA: Diagnosis not present

## 2022-01-07 DIAGNOSIS — G7000 Myasthenia gravis without (acute) exacerbation: Secondary | ICD-10-CM | POA: Diagnosis not present

## 2022-01-07 DIAGNOSIS — Z79899 Other long term (current) drug therapy: Secondary | ICD-10-CM | POA: Diagnosis not present

## 2022-01-07 DIAGNOSIS — D801 Nonfamilial hypogammaglobulinemia: Secondary | ICD-10-CM | POA: Diagnosis not present

## 2022-01-07 DIAGNOSIS — K529 Noninfective gastroenteritis and colitis, unspecified: Secondary | ICD-10-CM | POA: Diagnosis not present

## 2022-01-07 DIAGNOSIS — Z8619 Personal history of other infectious and parasitic diseases: Secondary | ICD-10-CM | POA: Diagnosis not present

## 2022-01-07 DIAGNOSIS — H02409 Unspecified ptosis of unspecified eyelid: Secondary | ICD-10-CM | POA: Diagnosis not present

## 2022-01-07 DIAGNOSIS — Z7952 Long term (current) use of systemic steroids: Secondary | ICD-10-CM | POA: Diagnosis not present

## 2022-01-07 DIAGNOSIS — R748 Abnormal levels of other serum enzymes: Secondary | ICD-10-CM | POA: Diagnosis not present

## 2022-01-07 DIAGNOSIS — Z8781 Personal history of (healed) traumatic fracture: Secondary | ICD-10-CM | POA: Diagnosis not present

## 2022-01-07 DIAGNOSIS — G7281 Critical illness myopathy: Secondary | ICD-10-CM | POA: Diagnosis not present

## 2022-01-07 DIAGNOSIS — Z794 Long term (current) use of insulin: Secondary | ICD-10-CM | POA: Diagnosis not present

## 2022-01-07 DIAGNOSIS — H532 Diplopia: Secondary | ICD-10-CM | POA: Diagnosis not present

## 2022-01-07 DIAGNOSIS — L8990 Pressure ulcer of unspecified site, unspecified stage: Secondary | ICD-10-CM | POA: Diagnosis not present

## 2022-01-14 DIAGNOSIS — L89153 Pressure ulcer of sacral region, stage 3: Secondary | ICD-10-CM | POA: Diagnosis not present

## 2022-01-22 DIAGNOSIS — L259 Unspecified contact dermatitis, unspecified cause: Secondary | ICD-10-CM | POA: Diagnosis not present

## 2022-01-22 DIAGNOSIS — L89153 Pressure ulcer of sacral region, stage 3: Secondary | ICD-10-CM | POA: Diagnosis not present

## 2022-01-24 DIAGNOSIS — F0390 Unspecified dementia without behavioral disturbance: Secondary | ICD-10-CM | POA: Diagnosis not present

## 2022-01-27 DIAGNOSIS — G7001 Myasthenia gravis with (acute) exacerbation: Secondary | ICD-10-CM | POA: Diagnosis not present

## 2022-01-27 DIAGNOSIS — M6281 Muscle weakness (generalized): Secondary | ICD-10-CM | POA: Diagnosis not present

## 2022-01-28 DIAGNOSIS — G7000 Myasthenia gravis without (acute) exacerbation: Secondary | ICD-10-CM | POA: Diagnosis not present

## 2022-01-28 DIAGNOSIS — L89153 Pressure ulcer of sacral region, stage 3: Secondary | ICD-10-CM | POA: Diagnosis not present

## 2022-01-28 DIAGNOSIS — M6281 Muscle weakness (generalized): Secondary | ICD-10-CM | POA: Diagnosis not present

## 2022-01-28 DIAGNOSIS — R202 Paresthesia of skin: Secondary | ICD-10-CM | POA: Diagnosis not present

## 2022-01-28 DIAGNOSIS — Z593 Problems related to living in residential institution: Secondary | ICD-10-CM | POA: Diagnosis not present

## 2022-01-28 DIAGNOSIS — G7001 Myasthenia gravis with (acute) exacerbation: Secondary | ICD-10-CM | POA: Diagnosis not present

## 2022-01-28 DIAGNOSIS — R2689 Other abnormalities of gait and mobility: Secondary | ICD-10-CM | POA: Diagnosis not present

## 2022-01-29 DIAGNOSIS — M6281 Muscle weakness (generalized): Secondary | ICD-10-CM | POA: Diagnosis not present

## 2022-01-29 DIAGNOSIS — G7001 Myasthenia gravis with (acute) exacerbation: Secondary | ICD-10-CM | POA: Diagnosis not present

## 2022-01-30 DIAGNOSIS — G7001 Myasthenia gravis with (acute) exacerbation: Secondary | ICD-10-CM | POA: Diagnosis not present

## 2022-01-30 DIAGNOSIS — M6281 Muscle weakness (generalized): Secondary | ICD-10-CM | POA: Diagnosis not present

## 2022-02-03 DIAGNOSIS — M6281 Muscle weakness (generalized): Secondary | ICD-10-CM | POA: Diagnosis not present

## 2022-02-03 DIAGNOSIS — G7001 Myasthenia gravis with (acute) exacerbation: Secondary | ICD-10-CM | POA: Diagnosis not present

## 2022-02-04 DIAGNOSIS — G7000 Myasthenia gravis without (acute) exacerbation: Secondary | ICD-10-CM | POA: Diagnosis not present

## 2022-02-05 DIAGNOSIS — M6281 Muscle weakness (generalized): Secondary | ICD-10-CM | POA: Diagnosis not present

## 2022-02-05 DIAGNOSIS — G7001 Myasthenia gravis with (acute) exacerbation: Secondary | ICD-10-CM | POA: Diagnosis not present

## 2022-02-06 DIAGNOSIS — G7001 Myasthenia gravis with (acute) exacerbation: Secondary | ICD-10-CM | POA: Diagnosis not present

## 2022-02-06 DIAGNOSIS — F4320 Adjustment disorder, unspecified: Secondary | ICD-10-CM | POA: Diagnosis not present

## 2022-02-06 DIAGNOSIS — Z593 Problems related to living in residential institution: Secondary | ICD-10-CM | POA: Diagnosis not present

## 2022-02-06 DIAGNOSIS — M6281 Muscle weakness (generalized): Secondary | ICD-10-CM | POA: Diagnosis not present

## 2022-02-07 DIAGNOSIS — G7001 Myasthenia gravis with (acute) exacerbation: Secondary | ICD-10-CM | POA: Diagnosis not present

## 2022-02-07 DIAGNOSIS — M6281 Muscle weakness (generalized): Secondary | ICD-10-CM | POA: Diagnosis not present

## 2022-02-10 DIAGNOSIS — G7001 Myasthenia gravis with (acute) exacerbation: Secondary | ICD-10-CM | POA: Diagnosis not present

## 2022-02-10 DIAGNOSIS — M6281 Muscle weakness (generalized): Secondary | ICD-10-CM | POA: Diagnosis not present

## 2022-02-11 DIAGNOSIS — M6281 Muscle weakness (generalized): Secondary | ICD-10-CM | POA: Diagnosis not present

## 2022-02-11 DIAGNOSIS — L89153 Pressure ulcer of sacral region, stage 3: Secondary | ICD-10-CM | POA: Diagnosis not present

## 2022-02-11 DIAGNOSIS — G7001 Myasthenia gravis with (acute) exacerbation: Secondary | ICD-10-CM | POA: Diagnosis not present

## 2022-02-13 DIAGNOSIS — L603 Nail dystrophy: Secondary | ICD-10-CM | POA: Diagnosis not present

## 2022-02-13 DIAGNOSIS — R891 Abnormal level of hormones in specimens from other organs, systems and tissues: Secondary | ICD-10-CM | POA: Diagnosis not present

## 2022-02-13 DIAGNOSIS — G8311 Monoplegia of lower limb affecting right dominant side: Secondary | ICD-10-CM | POA: Diagnosis not present

## 2022-02-13 DIAGNOSIS — R635 Abnormal weight gain: Secondary | ICD-10-CM | POA: Diagnosis not present

## 2022-02-13 DIAGNOSIS — N182 Chronic kidney disease, stage 2 (mild): Secondary | ICD-10-CM | POA: Diagnosis not present

## 2022-02-13 DIAGNOSIS — M6281 Muscle weakness (generalized): Secondary | ICD-10-CM | POA: Diagnosis not present

## 2022-02-13 DIAGNOSIS — I4891 Unspecified atrial fibrillation: Secondary | ICD-10-CM | POA: Diagnosis not present

## 2022-02-13 DIAGNOSIS — F419 Anxiety disorder, unspecified: Secondary | ICD-10-CM | POA: Diagnosis not present

## 2022-02-13 DIAGNOSIS — E1159 Type 2 diabetes mellitus with other circulatory complications: Secondary | ICD-10-CM | POA: Diagnosis not present

## 2022-02-13 DIAGNOSIS — G7001 Myasthenia gravis with (acute) exacerbation: Secondary | ICD-10-CM | POA: Diagnosis not present

## 2022-02-13 DIAGNOSIS — Z593 Problems related to living in residential institution: Secondary | ICD-10-CM | POA: Diagnosis not present

## 2022-02-13 DIAGNOSIS — R0989 Other specified symptoms and signs involving the circulatory and respiratory systems: Secondary | ICD-10-CM | POA: Diagnosis not present

## 2022-02-13 DIAGNOSIS — G8314 Monoplegia of lower limb affecting left nondominant side: Secondary | ICD-10-CM | POA: Diagnosis not present

## 2022-02-14 DIAGNOSIS — I509 Heart failure, unspecified: Secondary | ICD-10-CM | POA: Diagnosis not present

## 2022-02-14 DIAGNOSIS — E43 Unspecified severe protein-calorie malnutrition: Secondary | ICD-10-CM | POA: Diagnosis not present

## 2022-02-14 DIAGNOSIS — I4891 Unspecified atrial fibrillation: Secondary | ICD-10-CM | POA: Diagnosis not present

## 2022-02-14 DIAGNOSIS — G7001 Myasthenia gravis with (acute) exacerbation: Secondary | ICD-10-CM | POA: Diagnosis not present

## 2022-02-14 DIAGNOSIS — E119 Type 2 diabetes mellitus without complications: Secondary | ICD-10-CM | POA: Diagnosis not present

## 2022-02-17 DIAGNOSIS — M6281 Muscle weakness (generalized): Secondary | ICD-10-CM | POA: Diagnosis not present

## 2022-02-17 DIAGNOSIS — G7001 Myasthenia gravis with (acute) exacerbation: Secondary | ICD-10-CM | POA: Diagnosis not present

## 2022-02-18 DIAGNOSIS — G7001 Myasthenia gravis with (acute) exacerbation: Secondary | ICD-10-CM | POA: Diagnosis not present

## 2022-02-18 DIAGNOSIS — M6281 Muscle weakness (generalized): Secondary | ICD-10-CM | POA: Diagnosis not present

## 2022-02-18 DIAGNOSIS — L89153 Pressure ulcer of sacral region, stage 3: Secondary | ICD-10-CM | POA: Diagnosis not present

## 2022-02-19 DIAGNOSIS — M6281 Muscle weakness (generalized): Secondary | ICD-10-CM | POA: Diagnosis not present

## 2022-02-19 DIAGNOSIS — G7001 Myasthenia gravis with (acute) exacerbation: Secondary | ICD-10-CM | POA: Diagnosis not present

## 2022-03-06 DIAGNOSIS — G7000 Myasthenia gravis without (acute) exacerbation: Secondary | ICD-10-CM | POA: Diagnosis not present

## 2022-03-19 ENCOUNTER — Other Ambulatory Visit: Payer: Self-pay | Admitting: *Deleted

## 2022-03-19 NOTE — Patient Outreach (Signed)
  Care Coordination   03/19/2022 Name: Kristy Washington MRN: [id number] DOB: Jan 26, 1941   Care Coordination Outreach Attempts:  An unsuccessful telephone outreach was attempted today to offer the patient information about available care coordination services as a benefit of their health plan.   Follow Up Plan:  Additional outreach attempts will be made to offer the patient care coordination information and services.   Encounter Outcome:  No Answer  Care Coordination Interventions Activated:  No   Care Coordination Interventions:  No, not indicated    Valente David, RN, MSN, [place] Manager [phone]

## 2022-03-24 ENCOUNTER — Other Ambulatory Visit: Payer: Self-pay | Admitting: *Deleted

## 2022-03-24 NOTE — Patient Outreach (Signed)
  Care Coordination   03/24/2022 Name: Kristy Washington MRN: [id number] DOB: [birth year]   Care Coordination Outreach Attempts:  A second unsuccessful outreach was attempted today to offer the patient with information about available care coordination services as a benefit of their health plan.     Follow Up Plan:  Additional outreach attempts will be made to offer the patient care coordination information and services.   Encounter Outcome:  No Answer  Care Coordination Interventions Activated:  No   Care Coordination Interventions:  No, not indicated    Valente David, RN, MSN, [facility] Care coordinator [phone]

## 2022-03-25 ENCOUNTER — Other Ambulatory Visit: Payer: Self-pay | Admitting: *Deleted

## 2022-03-25 NOTE — Patient Outreach (Signed)
  Care Coordination   Initial Visit Note   03/25/2022 Name: Juliette Smethurst MRN: [id number] DOB: 1940-11-15  Ceana Stash is a 81 y.o. year old female who sees Vyas, Dhruv B, MD for primary care. I spoke with  Uchechi Mikkelsen by phone today  What matters to the patients health and wellness today?  Currently in rehab facility.  She is unsure what her long term plan will be, it depends on result of therapy sessions and recommendations.  She will contact this care manager if she is discharged home to help with transition and support.     SDOH assessments and interventions completed:  No     Care Coordination Interventions Activated:  No  Care Coordination Interventions:  No, not indicated   Follow up plan: No further intervention required.   Encounter Outcome:  Pt. Visit Completed   Valente David, RN, MSN, [facility] Care Coordinator [phone]

## 2022-03-27 DIAGNOSIS — R197 Diarrhea, unspecified: Secondary | ICD-10-CM | POA: Diagnosis not present

## 2022-03-27 DIAGNOSIS — Z593 Problems related to living in residential institution: Secondary | ICD-10-CM | POA: Diagnosis not present

## 2022-03-27 DIAGNOSIS — G7000 Myasthenia gravis without (acute) exacerbation: Secondary | ICD-10-CM | POA: Diagnosis not present

## 2022-04-15 DIAGNOSIS — Z79899 Other long term (current) drug therapy: Secondary | ICD-10-CM | POA: Diagnosis not present

## 2022-04-15 DIAGNOSIS — D801 Nonfamilial hypogammaglobulinemia: Secondary | ICD-10-CM | POA: Diagnosis not present

## 2022-04-15 DIAGNOSIS — Z7952 Long term (current) use of systemic steroids: Secondary | ICD-10-CM | POA: Diagnosis not present

## 2022-04-15 DIAGNOSIS — G7000 Myasthenia gravis without (acute) exacerbation: Secondary | ICD-10-CM | POA: Diagnosis not present
